# Patient Record
Sex: Female | Born: 1935 | Race: Black or African American | Hispanic: No | Smoking: Never smoker
Health system: Southern US, Community
[De-identification: ages and names within clinical notes are randomized; demographics above are authoritative.]

## PROBLEM LIST (undated history)

## (undated) DIAGNOSIS — E079 Disorder of thyroid, unspecified: Secondary | ICD-10-CM

## (undated) DIAGNOSIS — J45909 Unspecified asthma, uncomplicated: Secondary | ICD-10-CM

## (undated) HISTORY — PX: JOINT REPLACEMENT: SHX530

---

## 1998-07-25 ENCOUNTER — Ambulatory Visit (HOSPITAL_BASED_OUTPATIENT_CLINIC_OR_DEPARTMENT_OTHER): Admission: RE | Admit: 1998-07-25 | Discharge: 1998-07-25 | Payer: Self-pay | Admitting: Ophthalmology

## 2003-12-04 ENCOUNTER — Emergency Department (HOSPITAL_COMMUNITY): Admission: EM | Admit: 2003-12-04 | Discharge: 2003-12-04 | Payer: Self-pay

## 2004-02-13 ENCOUNTER — Emergency Department (HOSPITAL_COMMUNITY): Admission: EM | Admit: 2004-02-13 | Discharge: 2004-02-13 | Payer: Self-pay

## 2004-06-04 ENCOUNTER — Encounter: Admission: RE | Admit: 2004-06-04 | Discharge: 2004-06-04 | Payer: Self-pay | Admitting: Family Medicine

## 2004-09-23 ENCOUNTER — Ambulatory Visit (HOSPITAL_COMMUNITY): Admission: RE | Admit: 2004-09-23 | Discharge: 2004-09-23 | Payer: Self-pay | Admitting: Gastroenterology

## 2005-03-30 ENCOUNTER — Emergency Department (HOSPITAL_COMMUNITY): Admission: EM | Admit: 2005-03-30 | Discharge: 2005-03-30 | Payer: Self-pay | Admitting: Emergency Medicine

## 2009-01-29 ENCOUNTER — Emergency Department (HOSPITAL_COMMUNITY): Admission: EM | Admit: 2009-01-29 | Discharge: 2009-01-29 | Payer: Self-pay | Admitting: Emergency Medicine

## 2009-01-30 ENCOUNTER — Emergency Department (HOSPITAL_COMMUNITY): Admission: EM | Admit: 2009-01-30 | Discharge: 2009-01-31 | Payer: Self-pay | Admitting: Emergency Medicine

## 2010-01-16 ENCOUNTER — Emergency Department (HOSPITAL_COMMUNITY): Admission: EM | Admit: 2010-01-16 | Discharge: 2010-01-16 | Payer: Self-pay | Admitting: Emergency Medicine

## 2011-05-14 NOTE — Op Note (Signed)
NAMEAGRIPINA, Angela Brennan          ACCOUNT NO.:  [id number]   MEDICAL RECORD NO.:  [id number]          PATIENT TYPE:  AMB   LOCATION:  ENDO                         FACILITY:  [facility]   PHYSICIAN:  Anselmo Rod, M.D.  DATE OF BIRTH:  1932-09-29   DATE OF PROCEDURE:  09/23/2004  DATE OF DISCHARGE:  09/23/2004                                 OPERATIVE REPORT   PROCEDURE PERFORMED:  Screening colonoscopy.   ENDOSCOPIST:  Anselmo Rod, M.D.   INSTRUMENT USED:  Olympus video colonoscope.   INDICATION FOR PROCEDURE:  A 77 year old African-American female undergoing  screening colonoscopy.  Rule out colonic polyps, masses, etc.   PREPROCEDURE PREPARATION:  Informed consent was procured from the patient.  The patient was fasted for eight hours prior to the procedure and prepped  with a bottle of magnesium citrate and a gallon of GoLYTELY the night prior  to the procedure.   PREPROCEDURE PHYSICAL:  VITAL SIGNS:  The patient had stable vital signs.  NECK:  Supple.  CHEST:  Clear to auscultation.  S1, S2 regular.  No murmur, rub, or gallop,  rales, rhonchi, or wheezing.  ABDOMEN:  Soft with normal bowel sounds.   DESCRIPTION OF PROCEDURE:  The patient was placed in the left lateral  decubitus position and sedated with 60 mg of Demerol and 6 mg of Versed in  slow incremental doses.  Once the patient was adequately sedate and  maintained on low-flow oxygen and continuous cardiac monitoring, the Olympus  video colonoscope was advanced from the rectum to the cecum.  Except for  small internal and external hemorrhoids seen on retroflexion and anal  inspection, respectively, no other abnormalities were noted.  The entire  colonic mucosa appeared healthy with a normal vascular pattern.  The  appendiceal orifice and the ileocecal valve were clearly visualized and  photographed.  No masses, polyps, or diverticula were identified.  The  patient tolerated the procedure well without immediate  complications.   IMPRESSION:  1.  Normal colonoscopy up to the cecum except for small internal and      external hemorrhoids.  2.  No masses, polyps, or diverticula seen.   RECOMMENDATIONS:  1.  Continue a high-fiber diet with liberal fluid intake.  2.  Repeat CRC screening in the next 10 years unless the patient develops      any abnormal symptoms in the interim.  3.  Outpatient follow-up as the need arises in the future.       JNM/MEDQ  D:  09/24/2004  T:  09/25/2004  Job:  [id number]   cc:   Renaye Rakers, M.D.  [phone] [address]  [place]  [place] [address]  Fax: [phone]

## 2012-02-17 DIAGNOSIS — I10 Essential (primary) hypertension: Secondary | ICD-10-CM | POA: Diagnosis not present

## 2012-02-18 DIAGNOSIS — H2510 Age-related nuclear cataract, unspecified eye: Secondary | ICD-10-CM | POA: Diagnosis not present

## 2012-02-18 DIAGNOSIS — G43819 Other migraine, intractable, without status migrainosus: Secondary | ICD-10-CM | POA: Diagnosis not present

## 2012-02-18 DIAGNOSIS — H5710 Ocular pain, unspecified eye: Secondary | ICD-10-CM | POA: Diagnosis not present

## 2012-07-03 DIAGNOSIS — H6120 Impacted cerumen, unspecified ear: Secondary | ICD-10-CM | POA: Diagnosis not present

## 2012-07-04 DIAGNOSIS — I10 Essential (primary) hypertension: Secondary | ICD-10-CM | POA: Diagnosis not present

## 2012-07-06 DIAGNOSIS — H6120 Impacted cerumen, unspecified ear: Secondary | ICD-10-CM | POA: Diagnosis not present

## 2012-08-21 DIAGNOSIS — J069 Acute upper respiratory infection, unspecified: Secondary | ICD-10-CM | POA: Diagnosis not present

## 2012-10-12 DIAGNOSIS — H6690 Otitis media, unspecified, unspecified ear: Secondary | ICD-10-CM | POA: Diagnosis not present

## 2012-10-23 DIAGNOSIS — H6690 Otitis media, unspecified, unspecified ear: Secondary | ICD-10-CM | POA: Diagnosis not present

## 2012-10-30 DIAGNOSIS — H538 Other visual disturbances: Secondary | ICD-10-CM | POA: Diagnosis not present

## 2012-10-30 DIAGNOSIS — H2510 Age-related nuclear cataract, unspecified eye: Secondary | ICD-10-CM | POA: Diagnosis not present

## 2012-10-30 DIAGNOSIS — H81399 Other peripheral vertigo, unspecified ear: Secondary | ICD-10-CM | POA: Diagnosis not present

## 2012-11-01 DIAGNOSIS — H8190 Unspecified disorder of vestibular function, unspecified ear: Secondary | ICD-10-CM | POA: Diagnosis not present

## 2012-11-13 ENCOUNTER — Emergency Department (HOSPITAL_COMMUNITY)
Admission: EM | Admit: 2012-11-13 | Discharge: 2012-11-13 | Disposition: A | Discharge status: Home / Self Care | Payer: Medicare Other | Attending: Emergency Medicine | Admitting: Emergency Medicine

## 2012-11-13 ENCOUNTER — Encounter (HOSPITAL_COMMUNITY): Payer: Self-pay

## 2012-11-13 DIAGNOSIS — E079 Disorder of thyroid, unspecified: Secondary | ICD-10-CM | POA: Insufficient documentation

## 2012-11-13 DIAGNOSIS — F411 Generalized anxiety disorder: Secondary | ICD-10-CM | POA: Diagnosis not present

## 2012-11-13 DIAGNOSIS — R002 Palpitations: Secondary | ICD-10-CM

## 2012-11-13 DIAGNOSIS — J45909 Unspecified asthma, uncomplicated: Secondary | ICD-10-CM | POA: Diagnosis not present

## 2012-11-13 DIAGNOSIS — H938X9 Other specified disorders of ear, unspecified ear: Secondary | ICD-10-CM | POA: Insufficient documentation

## 2012-11-13 DIAGNOSIS — H6590 Unspecified nonsuppurative otitis media, unspecified ear: Secondary | ICD-10-CM

## 2012-11-13 DIAGNOSIS — R6889 Other general symptoms and signs: Secondary | ICD-10-CM | POA: Diagnosis not present

## 2012-11-13 HISTORY — DX: Unspecified asthma, uncomplicated: J45.909

## 2012-11-13 HISTORY — DX: Disorder of thyroid, unspecified: E07.9

## 2012-11-13 NOTE — ED Notes (Signed)
Pt sts she was driving to a Dr. appointment and started having palpitations.  Pt sts no chest pain but felt her heart speed up.

## 2012-11-13 NOTE — ED Provider Notes (Signed)
History     CSN: [id number]  Arrival date & time 11/13/12  1412   First MD Initiated Contact with Patient 11/13/12 1436      Chief Complaint  Patient presents with  . Palpitations  . Anxiety    (Consider location/radiation/quality/duration/timing/severity/associated sxs/prior treatment) HPIElizabeth A Brennan is a 78 y.o. female who was driving to her PCP office when she had an episode of palpitations "felt like my heart was racing." This was not associated with diaphoresis, dyspnea, chest pain, dizziness, vertigo, facial pain, cough. She said she ate lunch prior to leaving for the MD office and may have eaten too fast.  She felt uncomfortable after the fleeting palpitations, so she turned around and returned home calling 911 on the way. EMS picked her up at home.   Pt had left ear irrigated and got secondarily infected.  She had seen an ENT in consult, with drops to the ear x1, it was getting better, had no vertigo.  She takes synthroid - no recent dose changes, Dr. Parke Simmers checked her TSH 4 weeks ago and it was normal.   Past Medical History  Diagnosis Date  . Asthma   . Thyroid disease     Past Surgical History  Procedure Date  . Joint replacement     History reviewed. No pertinent family history.  History  Substance Use Topics  . Smoking status: Never Smoker   . Smokeless tobacco: Not on file  . Alcohol Use:     OB History    Grav Para Term Preterm Abortions TAB SAB Ect Mult Living                  Review of Systems At least 10pt or greater review of systems completed and are negative except where specified in the HPI.  Allergies  Review of patient's allergies indicates not on file.  Home Medications  No current outpatient prescriptions on file.  BP 117/63  Pulse 69  Temp 98.2 F (36.8 C) (Oral)  Resp 16  SpO2 98%  Physical Exam  Nursing notes reviewed.  Electronic medical record reviewed. VITAL SIGNS:   Filed Vitals:   11/13/12 1412 11/13/12  1416  BP:  117/63  Pulse:  69  Temp:  98.2 F (36.8 C)  TempSrc:  Oral  Resp:  16  SpO2: 100% 98%   CONSTITUTIONAL: Awake, oriented, appears non-toxic HENT: Atraumatic, normocephalic, oral mucosa pink and moist, airway patent. Nares patent without drainage. External ears normal. Left middle ear effusion. EYES: Conjunctiva clear, EOMI, PERRLA NECK: Trachea midline, non-tender, supple CARDIOVASCULAR: Normal heart rate, Normal rhythm, No murmurs, rubs, gallops PULMONARY/CHEST: Clear to auscultation, no rhonchi, wheezes, or rales. Symmetrical breath sounds. Non-tender. ABDOMINAL: Non-distended, soft, non-tender - no rebound or guarding.  BS normal. NEUROLOGIC: Non-focal, moving all four extremities, no gross sensory or motor deficits. EXTREMITIES: No clubbing, cyanosis, or edema SKIN: Warm, Dry, No erythema, No rash  ED Course  Procedures (including critical care time)  Date: 11/14/2012  Rate: 65  Rhythm: normal sinus rhythm  QRS Axis: normal  Intervals: normal  ST/T Wave abnormalities: non-specific T-wave changes  Conduction Disutrbances: none  Narrative Interpretation: unremarkable, non-ischemic, no arrythmia  Labs Reviewed - No data to display No results found.   1. Heart palpitations   2. Middle ear effusion       MDM  Angela Brennan is a 78 y.o. female presents with palpitations.  Palpitations were transient and had no other associated symptoms.  Without other symptoms, a  normal EKG here, I don't think any further workup is needed.  PT appears well and has normal VS.  Doubt a lethal arrythmia such as Vtach without some dizziness etc.  Discussed options for diagnostics going forward including possible cardiac event monitoring.  Pt would like to follow up with PCP for this.  She would like to go home, I see no reason to admit to the hospital now - no further testing indicated at this time.  I explained the diagnosis and have given explicit precautions to return to the  ER including recurrent palpitations associated with any other symptoms or any other new or worsening symptoms. The patient understands and accepts the medical plan as it's been dictated and I have answered their questions. Discharge instructions concerning home care and prescriptions have been given.  The patient is STABLE and is discharged to home in good condition.         Jones Skene, MD 11/14/12 1805

## 2012-12-11 DIAGNOSIS — H81399 Other peripheral vertigo, unspecified ear: Secondary | ICD-10-CM | POA: Diagnosis not present

## 2012-12-26 DIAGNOSIS — H9209 Otalgia, unspecified ear: Secondary | ICD-10-CM | POA: Diagnosis not present

## 2013-01-22 DIAGNOSIS — I10 Essential (primary) hypertension: Secondary | ICD-10-CM | POA: Diagnosis not present

## 2013-01-22 DIAGNOSIS — J209 Acute bronchitis, unspecified: Secondary | ICD-10-CM | POA: Diagnosis not present

## 2013-04-23 DIAGNOSIS — E7800 Pure hypercholesterolemia, unspecified: Secondary | ICD-10-CM | POA: Diagnosis not present

## 2013-04-23 DIAGNOSIS — I10 Essential (primary) hypertension: Secondary | ICD-10-CM | POA: Diagnosis not present

## 2013-04-23 DIAGNOSIS — E781 Pure hyperglyceridemia: Secondary | ICD-10-CM | POA: Diagnosis not present

## 2013-09-03 DIAGNOSIS — E781 Pure hyperglyceridemia: Secondary | ICD-10-CM | POA: Diagnosis not present

## 2013-09-03 DIAGNOSIS — I10 Essential (primary) hypertension: Secondary | ICD-10-CM | POA: Diagnosis not present

## 2013-11-07 DIAGNOSIS — I10 Essential (primary) hypertension: Secondary | ICD-10-CM | POA: Diagnosis not present

## 2013-11-07 DIAGNOSIS — E7800 Pure hypercholesterolemia, unspecified: Secondary | ICD-10-CM | POA: Diagnosis not present

## 2014-01-28 DIAGNOSIS — H60399 Other infective otitis externa, unspecified ear: Secondary | ICD-10-CM | POA: Diagnosis not present

## 2014-01-28 DIAGNOSIS — M1250 Traumatic arthropathy, unspecified site: Secondary | ICD-10-CM | POA: Diagnosis not present

## 2014-01-31 ENCOUNTER — Ambulatory Visit
Admission: RE | Admit: 2014-01-31 | Discharge: 2014-01-31 | Disposition: A | Discharge status: Home / Self Care | Payer: Medicare Other | Source: Ambulatory Visit | Attending: Family Medicine | Admitting: Family Medicine

## 2014-01-31 ENCOUNTER — Other Ambulatory Visit: Payer: Self-pay | Admitting: Family Medicine

## 2014-01-31 DIAGNOSIS — IMO0002 Reserved for concepts with insufficient information to code with codable children: Secondary | ICD-10-CM | POA: Diagnosis not present

## 2014-01-31 DIAGNOSIS — M1250 Traumatic arthropathy, unspecified site: Secondary | ICD-10-CM

## 2014-01-31 DIAGNOSIS — M1710 Unilateral primary osteoarthritis, unspecified knee: Secondary | ICD-10-CM | POA: Diagnosis not present

## 2014-02-14 DIAGNOSIS — J069 Acute upper respiratory infection, unspecified: Secondary | ICD-10-CM | POA: Diagnosis not present

## 2014-04-16 DIAGNOSIS — F329 Major depressive disorder, single episode, unspecified: Secondary | ICD-10-CM | POA: Diagnosis not present

## 2014-04-16 DIAGNOSIS — F411 Generalized anxiety disorder: Secondary | ICD-10-CM | POA: Diagnosis not present

## 2014-04-16 DIAGNOSIS — F3289 Other specified depressive episodes: Secondary | ICD-10-CM | POA: Diagnosis not present

## 2014-05-22 DIAGNOSIS — E781 Pure hyperglyceridemia: Secondary | ICD-10-CM | POA: Diagnosis not present

## 2014-05-22 DIAGNOSIS — F411 Generalized anxiety disorder: Secondary | ICD-10-CM | POA: Diagnosis not present

## 2014-05-22 DIAGNOSIS — I10 Essential (primary) hypertension: Secondary | ICD-10-CM | POA: Diagnosis not present

## 2014-06-28 DIAGNOSIS — J449 Chronic obstructive pulmonary disease, unspecified: Secondary | ICD-10-CM | POA: Diagnosis not present

## 2014-06-28 DIAGNOSIS — I10 Essential (primary) hypertension: Secondary | ICD-10-CM | POA: Diagnosis not present

## 2014-08-21 DIAGNOSIS — I10 Essential (primary) hypertension: Secondary | ICD-10-CM | POA: Diagnosis not present

## 2014-08-21 DIAGNOSIS — E781 Pure hyperglyceridemia: Secondary | ICD-10-CM | POA: Diagnosis not present

## 2014-08-21 DIAGNOSIS — J019 Acute sinusitis, unspecified: Secondary | ICD-10-CM | POA: Diagnosis not present

## 2014-10-20 ENCOUNTER — Encounter (HOSPITAL_COMMUNITY): Payer: Self-pay | Admitting: Emergency Medicine

## 2014-10-20 ENCOUNTER — Emergency Department (HOSPITAL_COMMUNITY): Payer: Medicare Other

## 2014-10-20 DIAGNOSIS — Z880 Allergy status to penicillin: Secondary | ICD-10-CM | POA: Insufficient documentation

## 2014-10-20 DIAGNOSIS — E039 Hypothyroidism, unspecified: Secondary | ICD-10-CM | POA: Insufficient documentation

## 2014-10-20 DIAGNOSIS — R609 Edema, unspecified: Secondary | ICD-10-CM | POA: Insufficient documentation

## 2014-10-20 DIAGNOSIS — J45901 Unspecified asthma with (acute) exacerbation: Secondary | ICD-10-CM | POA: Insufficient documentation

## 2014-10-20 DIAGNOSIS — Z79899 Other long term (current) drug therapy: Secondary | ICD-10-CM | POA: Diagnosis not present

## 2014-10-20 DIAGNOSIS — R0602 Shortness of breath: Secondary | ICD-10-CM | POA: Diagnosis not present

## 2014-10-20 MED ORDER — IPRATROPIUM BROMIDE 0.02 % IN SOLN
0.5000 mg | Freq: Once | RESPIRATORY_TRACT | Status: AC
  Administered 2014-10-20: 0.5 mg via RESPIRATORY_TRACT
  Filled 2014-10-20: qty 2.5

## 2014-10-20 MED ORDER — ALBUTEROL SULFATE (2.5 MG/3ML) 0.083% IN NEBU
5.0000 mg | INHALATION_SOLUTION | Freq: Once | RESPIRATORY_TRACT | Status: AC
  Administered 2014-10-20: 5 mg via RESPIRATORY_TRACT
  Filled 2014-10-20: qty 6

## 2014-10-20 NOTE — ED Notes (Signed)
The pt has asthma and she started having  Difficulty breqathing that started this afternoon.  She has an inhaler but it is not helping.  No pain anywhere.  No audible wheezes

## 2014-10-21 ENCOUNTER — Emergency Department (HOSPITAL_COMMUNITY)
Admission: EM | Admit: 2014-10-21 | Discharge: 2014-10-21 | Disposition: A | Discharge status: Home / Self Care | Payer: Medicare Other | Attending: Emergency Medicine | Admitting: Emergency Medicine

## 2014-10-21 DIAGNOSIS — R0602 Shortness of breath: Secondary | ICD-10-CM

## 2014-10-21 DIAGNOSIS — J45901 Unspecified asthma with (acute) exacerbation: Secondary | ICD-10-CM | POA: Diagnosis not present

## 2014-10-21 LAB — I-STAT TROPONIN, ED
Troponin i, poc: 0 ng/mL (ref 0.00–0.08)
Troponin i, poc: 0.01 ng/mL (ref 0.00–0.08)

## 2014-10-21 LAB — CBC WITH DIFFERENTIAL/PLATELET
Basophils Absolute: 0.1 10*3/uL (ref 0.0–0.1)
Basophils Relative: 2 % — ABNORMAL HIGH (ref 0–1)
Eosinophils Absolute: 0.4 10*3/uL (ref 0.0–0.7)
Eosinophils Relative: 8 % — ABNORMAL HIGH (ref 0–5)
HCT: 37 % (ref 36.0–46.0)
Hemoglobin: 12.4 g/dL (ref 12.0–15.0)
Lymphocytes Relative: 40 % (ref 12–46)
Lymphs Abs: 2 10*3/uL (ref 0.7–4.0)
MCH: 30.7 pg (ref 26.0–34.0)
MCHC: 33.5 g/dL (ref 30.0–36.0)
MCV: 91.6 fL (ref 78.0–100.0)
Monocytes Absolute: 0.5 10*3/uL (ref 0.1–1.0)
Monocytes Relative: 11 % (ref 3–12)
Neutro Abs: 2.1 10*3/uL (ref 1.7–7.7)
Neutrophils Relative %: 41 % — ABNORMAL LOW (ref 43–77)
Platelets: 228 10*3/uL (ref 150–400)
RBC: 4.04 MIL/uL (ref 3.87–5.11)
RDW: 14.9 % (ref 11.5–15.5)
WBC: 5.1 10*3/uL (ref 4.0–10.5)

## 2014-10-21 LAB — BASIC METABOLIC PANEL
Anion gap: 13 (ref 5–15)
BUN: 12 mg/dL (ref 6–23)
CO2: 25 mEq/L (ref 19–32)
Calcium: 9.4 mg/dL (ref 8.4–10.5)
Chloride: 105 mEq/L (ref 96–112)
Creatinine, Ser: 0.89 mg/dL (ref 0.50–1.10)
GFR calc Af Amer: 69 mL/min — ABNORMAL LOW (ref >90)
GFR calc non Af Amer: 60 mL/min — ABNORMAL LOW (ref >90)
Glucose, Bld: 104 mg/dL — ABNORMAL HIGH (ref 70–99)
Potassium: 3.6 mEq/L — ABNORMAL LOW (ref 3.7–5.3)
Sodium: 143 mEq/L (ref 137–147)

## 2014-10-21 MED ORDER — ASPIRIN 325 MG PO TABS
325.0000 mg | ORAL_TABLET | Freq: Once | ORAL | Status: AC
  Administered 2014-10-21: 325 mg via ORAL
  Filled 2014-10-21: qty 1

## 2014-10-21 MED ORDER — ALBUTEROL SULFATE HFA 108 (90 BASE) MCG/ACT IN AERS
2.0000 | INHALATION_SPRAY | Freq: Once | RESPIRATORY_TRACT | Status: AC
  Administered 2014-10-21: 2 via RESPIRATORY_TRACT
  Filled 2014-10-21: qty 6.7

## 2014-10-21 NOTE — Discharge Instructions (Signed)
Shortness of Breath Angela Brennan, you were seen for shortness of breath. Your cardiac blood levels, EKG and chest x-ray were all normal. Follow-up with your primary care physician within 3 days for continued treatment of your shortness of breath. If any of your symptoms worsen come back to emergency department immediately for repeat evaluation. Thank you. Shortness of breath means you have trouble breathing. Shortness of breath needs medical care right away. HOME CARE   Do not smoke.  Avoid being around chemicals or things (paint fumes, dust) that may bother your breathing.  Rest as needed. Slowly begin your normal activities.  Only take medicines as told by your doctor.  Keep all doctor visits as told. GET HELP RIGHT AWAY IF:   Your shortness of breath gets worse.  You feel lightheaded, pass out (faint), or have a cough that is not helped by medicine.  You cough up blood.  You have pain with breathing.  You have pain in your chest, arms, shoulders, or belly (abdomen).  You have a fever.  You cannot walk up stairs or exercise the way you normally do.  You do not get better in the time expected.  You have a hard time doing normal activities even with rest.  You have problems with your medicines.  You have any new symptoms. MAKE SURE YOU:  Understand these instructions.  Will watch your condition.  Will get help right away if you are not doing well or get worse. Document Released: 05/31/2008 Document Revised: 12/18/2013 Document Reviewed: 02/28/2012 [facility] Patient Information 2015 [place]Care, [place]. This information is not intended to replace advice given to you by your health care provider. Make sure you discuss any questions you have with your health care provider.

## 2014-10-21 NOTE — ED Notes (Signed)
Pt discharged home with all belongings, pt alert, oriented and ambulatory upon discharge. No new RX prescribed, pt verbalizes understanding of discharge instructions, pt driven home by family at bedside

## 2014-10-21 NOTE — ED Provider Notes (Signed)
CSN: [id number]     Arrival date & time 10/20/14  2109 History   First MD Initiated Contact with Patient 10/21/14 0111     Chief Complaint  Patient presents with  . Shortness of Breath     (Consider location/radiation/quality/duration/timing/severity/associated sxs/prior Treatment) HPI Angela Brennan is a 80 y.o. female with past medical history of hyperlipidemia, asthma, hypothyroidism coming in with shortness of breath. She states this began earlier this afternoon while at church. He has a history of asthma and thought she had an acute exacerbation. She did not have her albuterol with her. She states her trigger is unknown to her. She denies any chest pain. She denies any history of fevers or URI symptoms.  Patient states the soreness of breath has now resolved without any intervention. She did receive a breathing treatment in triage. She called her primary care physician Dr. Vedia CofferBlack who she has been with for 30 years who advised her to go to the emergency department for evaluation.  10 Systems reviewed and are negative for acute change except as noted in the HPI.     Past Medical History  Diagnosis Date  . Asthma   . Thyroid disease    Past Surgical History  Procedure Laterality Date  . Joint replacement     No family history on file. History  Substance Use Topics  . Smoking status: Never Smoker   . Smokeless tobacco: Not on file  . Alcohol Use: Yes   OB History   Grav Para Term Preterm Abortions TAB SAB Ect Mult Living                 Review of Systems    Allergies  Penicillins  Home Medications   Prior to Admission medications   Medication Sig Start Date End Date Taking? Authorizing Provider  diltiazem (TIAZAC) 120 MG 24 hr capsule Take 120 mg by mouth daily.    Historical Provider, MD  irbesartan-hydrochlorothiazide (AVALIDE) 150-12.5 MG per tablet Take 1 tablet by mouth daily.    Historical Provider, MD  levothyroxine (SYNTHROID, LEVOTHROID) 50 MCG  tablet Take 50 mcg by mouth daily.    Historical Provider, MD   BP 122/60  Pulse 61  Temp(Src) 98.4 F (36.9 C) (Oral)  Resp 18  Ht 5' (1.524 m)  Wt 122 lb (55.339 kg)  BMI 23.83 kg/m2  SpO2 100% Physical Exam  Nursing note and vitals reviewed. Constitutional: She is oriented to person, place, and time. She appears well-developed and well-nourished. No distress.  HENT:  Head: Normocephalic and atraumatic.  Nose: Nose normal.  Mouth/Throat: Oropharynx is clear and moist. No oropharyngeal exudate.  Eyes: Conjunctivae and EOM are normal. Pupils are equal, round, and reactive to light. No scleral icterus.  Neck: Normal range of motion. Neck supple. No JVD present. No tracheal deviation present. No thyromegaly present.  Cardiovascular: Normal rate, regular rhythm and normal heart sounds.  Exam reveals no gallop and no friction rub.   No murmur heard. Pulmonary/Chest: Effort normal and breath sounds normal. No respiratory distress. She has no wheezes. She exhibits no tenderness.  Abdominal: Soft. Bowel sounds are normal. She exhibits no distension and no mass. There is no tenderness. There is no rebound and no guarding.  Musculoskeletal: Normal range of motion. She exhibits edema. She exhibits no tenderness.  Lymphadenopathy:    She has no cervical adenopathy.  Neurological: She is alert and oriented to person, place, and time. No cranial nerve deficit. She exhibits normal muscle tone.  Skin: Skin is warm and dry. No rash noted. She is not diaphoretic. No erythema. No pallor.    ED Course  Procedures (including critical care time) Labs Review Labs Reviewed  CBC WITH DIFFERENTIAL - Abnormal; Notable for the following:    Neutrophils Relative % 41 (*)    Eosinophils Relative 8 (*)    Basophils Relative 2 (*)    All other components within normal limits  BASIC METABOLIC PANEL - Abnormal; Notable for the following:    Potassium 3.6 (*)    Glucose, Bld 104 (*)    GFR calc non Af Amer  60 (*)    GFR calc Af Amer 69 (*)    All other components within normal limits  I-STAT TROPOININ, ED  Rosezena SensorI-STAT TROPOININ, ED    Imaging Review Dg Chest 2 View  10/20/2014   CLINICAL DATA:  Shortness of breath.  Initial encounter  EXAM: CHEST  2 VIEW  COMPARISON:  None.  FINDINGS: Borderline cardiomegaly. Gas-filled lower mediastinal mass consistent with hiatal hernia. Negative aortic contours.  There is no edema, consolidation, effusion, or pneumothorax.  No acute osseous findings.  IMPRESSION: 1. No evidence of acute cardiopulmonary disease. 2. Large hiatal hernia.   Electronically Signed   By: Tiburcio PeaJonathan  Watts M.D.   On: 10/20/2014 23:10     EKG Interpretation   Date/Time:  Monday October 21 2014 01:42:35 EDT Ventricular Rate:  57 PR Interval:  178 QRS Duration: 93 QT Interval:  416 QTC Calculation: 405 R Axis:   -6 Text Interpretation:  Sinus rhythm Borderline low voltage, extremity leads  No significant change since last tracing Confirmed by Erroll Lunani, Takima Encina  [place] [phone]([id number]) on 10/21/2014 2:00:01 AM      MDM   Final diagnoses:  SOB (shortness of breath)    Patient presented to emergency department for shortness of breath. Since the patient is elderly and female she can present atypically for ACS. Will evaluate with 2 troponins and heart score.  Troponins are negative 2. EKG is unchanged and does not reveal any ischemia.  Patient will be discharged home with primary care follow-up within 3 days for possible stress test. She is amenable to this plan. Albuterol inhaler was refilled for her. Her vital signs remained stable and she is safe for discharge.   Tomasita CrumbleAdeleke Zaion Hreha, MD 10/21/14 [phone]

## 2014-10-24 DIAGNOSIS — I517 Cardiomegaly: Secondary | ICD-10-CM | POA: Diagnosis not present

## 2014-10-24 DIAGNOSIS — J4520 Mild intermittent asthma, uncomplicated: Secondary | ICD-10-CM | POA: Diagnosis not present

## 2014-10-24 DIAGNOSIS — E039 Hypothyroidism, unspecified: Secondary | ICD-10-CM | POA: Diagnosis not present

## 2014-11-19 DIAGNOSIS — I10 Essential (primary) hypertension: Secondary | ICD-10-CM | POA: Diagnosis not present

## 2014-11-19 DIAGNOSIS — J4530 Mild persistent asthma, uncomplicated: Secondary | ICD-10-CM | POA: Diagnosis not present

## 2014-11-19 DIAGNOSIS — E780 Pure hypercholesterolemia: Secondary | ICD-10-CM | POA: Diagnosis not present

## 2014-11-19 DIAGNOSIS — E039 Hypothyroidism, unspecified: Secondary | ICD-10-CM | POA: Diagnosis not present

## 2015-02-17 DIAGNOSIS — E039 Hypothyroidism, unspecified: Secondary | ICD-10-CM | POA: Diagnosis not present

## 2015-02-17 DIAGNOSIS — I10 Essential (primary) hypertension: Secondary | ICD-10-CM | POA: Diagnosis not present

## 2015-02-17 DIAGNOSIS — I517 Cardiomegaly: Secondary | ICD-10-CM | POA: Diagnosis not present

## 2015-02-17 DIAGNOSIS — H8303 Labyrinthitis, bilateral: Secondary | ICD-10-CM | POA: Diagnosis not present

## 2015-02-28 DIAGNOSIS — I10 Essential (primary) hypertension: Secondary | ICD-10-CM | POA: Diagnosis not present

## 2015-02-28 DIAGNOSIS — F064 Anxiety disorder due to known physiological condition: Secondary | ICD-10-CM | POA: Diagnosis not present

## 2015-05-19 DIAGNOSIS — E039 Hypothyroidism, unspecified: Secondary | ICD-10-CM | POA: Diagnosis not present

## 2015-05-19 DIAGNOSIS — E780 Pure hypercholesterolemia: Secondary | ICD-10-CM | POA: Diagnosis not present

## 2015-05-19 DIAGNOSIS — I10 Essential (primary) hypertension: Secondary | ICD-10-CM | POA: Diagnosis not present

## 2015-07-03 DIAGNOSIS — E039 Hypothyroidism, unspecified: Secondary | ICD-10-CM | POA: Diagnosis not present

## 2015-07-03 DIAGNOSIS — R413 Other amnesia: Secondary | ICD-10-CM | POA: Diagnosis not present

## 2015-07-03 DIAGNOSIS — I10 Essential (primary) hypertension: Secondary | ICD-10-CM | POA: Diagnosis not present

## 2015-07-03 DIAGNOSIS — R634 Abnormal weight loss: Secondary | ICD-10-CM | POA: Diagnosis not present

## 2015-08-14 DIAGNOSIS — E780 Pure hypercholesterolemia: Secondary | ICD-10-CM | POA: Diagnosis not present

## 2015-08-14 DIAGNOSIS — I10 Essential (primary) hypertension: Secondary | ICD-10-CM | POA: Diagnosis not present

## 2015-08-14 DIAGNOSIS — I517 Cardiomegaly: Secondary | ICD-10-CM | POA: Diagnosis not present

## 2015-08-14 DIAGNOSIS — J4520 Mild intermittent asthma, uncomplicated: Secondary | ICD-10-CM | POA: Diagnosis not present

## 2015-09-04 ENCOUNTER — Emergency Department (HOSPITAL_COMMUNITY): Payer: Medicare Other

## 2015-09-04 ENCOUNTER — Encounter (HOSPITAL_COMMUNITY): Payer: Self-pay | Admitting: Emergency Medicine

## 2015-09-04 ENCOUNTER — Emergency Department (HOSPITAL_COMMUNITY)
Admission: EM | Admit: 2015-09-04 | Discharge: 2015-09-04 | Disposition: A | Discharge status: Home / Self Care | Payer: Medicare Other | Attending: Emergency Medicine | Admitting: Emergency Medicine

## 2015-09-04 DIAGNOSIS — E079 Disorder of thyroid, unspecified: Secondary | ICD-10-CM | POA: Insufficient documentation

## 2015-09-04 DIAGNOSIS — Z880 Allergy status to penicillin: Secondary | ICD-10-CM | POA: Diagnosis not present

## 2015-09-04 DIAGNOSIS — R001 Bradycardia, unspecified: Secondary | ICD-10-CM | POA: Diagnosis not present

## 2015-09-04 DIAGNOSIS — J45901 Unspecified asthma with (acute) exacerbation: Secondary | ICD-10-CM | POA: Diagnosis not present

## 2015-09-04 DIAGNOSIS — R0602 Shortness of breath: Secondary | ICD-10-CM | POA: Diagnosis not present

## 2015-09-04 DIAGNOSIS — R069 Unspecified abnormalities of breathing: Secondary | ICD-10-CM | POA: Diagnosis not present

## 2015-09-04 DIAGNOSIS — Z79899 Other long term (current) drug therapy: Secondary | ICD-10-CM | POA: Diagnosis not present

## 2015-09-04 LAB — URINALYSIS, ROUTINE W REFLEX MICROSCOPIC
BILIRUBIN URINE: NEGATIVE
Glucose, UA: NEGATIVE mg/dL
HGB URINE DIPSTICK: NEGATIVE
KETONES UR: NEGATIVE mg/dL
Leukocytes, UA: NEGATIVE
NITRITE: NEGATIVE
PH: 6.5 (ref 5.0–8.0)
Protein, ur: NEGATIVE mg/dL
Specific Gravity, Urine: 1.004 — ABNORMAL LOW (ref 1.005–1.030)
UROBILINOGEN UA: 0.2 mg/dL (ref 0.0–1.0)

## 2015-09-04 LAB — I-STAT TROPONIN, ED
Troponin i, poc: 0.01 ng/mL (ref 0.00–0.08)
Troponin i, poc: 0.01 ng/mL (ref 0.00–0.08)

## 2015-09-04 LAB — BASIC METABOLIC PANEL
Anion gap: 9 (ref 5–15)
BUN: 15 mg/dL (ref 6–20)
CALCIUM: 9.6 mg/dL (ref 8.9–10.3)
CHLORIDE: 105 mmol/L (ref 101–111)
CO2: 26 mmol/L (ref 22–32)
CREATININE: 0.8 mg/dL (ref 0.44–1.00)
GFR calc non Af Amer: >60 mL/min (ref >60)
GLUCOSE: 92 mg/dL (ref 65–99)
Potassium: 4.1 mmol/L (ref 3.5–5.1)
Sodium: 140 mmol/L (ref 135–145)

## 2015-09-04 LAB — CBC
HCT: 39.3 % (ref 36.0–46.0)
Hemoglobin: 13.3 g/dL (ref 12.0–15.0)
MCH: 32 pg (ref 26.0–34.0)
MCHC: 33.8 g/dL (ref 30.0–36.0)
MCV: 94.5 fL (ref 78.0–100.0)
PLATELETS: 240 10*3/uL (ref 150–400)
RBC: 4.16 MIL/uL (ref 3.87–5.11)
RDW: 13.1 % (ref 11.5–15.5)
WBC: 4.3 10*3/uL (ref 4.0–10.5)

## 2015-09-04 LAB — BRAIN NATRIURETIC PEPTIDE: B Natriuretic Peptide: 74.1 pg/mL (ref 0.0–100.0)

## 2015-09-04 NOTE — ED Notes (Signed)
Patient ambulate with no problem by herself.

## 2015-09-04 NOTE — Discharge Instructions (Signed)
Bradycardia °Bradycardia is a term for a heart rate (pulse) that, in adults, is slower than 60 beats per minute. A normal rate is 60 to 100 beats per minute. A heart rate below 60 beats per minute may be normal for some adults with healthy hearts. If the rate is too slow, the heart may have trouble pumping the volume of blood the body needs. If the heart rate gets too low, blood flow to the brain may be decreased and may make you feel lightheaded, dizzy, or faint. °The heart has a natural pacemaker in the top of the heart called the SA node (sinoatrial or sinus node). This pacemaker sends out regular electrical signals to the muscle of the heart, telling the heart muscle when to beat (contract). The electrical signal travels from the upper parts of the heart (atria) through the AV node (atrioventricular node), to the lower chambers of the heart (ventricles). The ventricles squeeze, pumping the blood from your heart to your lungs and to the rest of your body. °CAUSES  °· Problem with the heart's electrical system. °· Problem with the heart's natural pacemaker. °· Heart disease, damage, or infection. °· Medications. °· Problems with minerals and salts (electrolytes). °SYMPTOMS  °· Fainting (syncope). °· Fatigue and weakness. °· Shortness of breath (dyspnea). °· Chest pain (angina). °· Drowsiness. °· Confusion. °DIAGNOSIS  °· An electrocardiogram (ECG) can help your caregiver determine the type of slow heart rate you have. °· If the cause is not seen on an ECG, you may need to wear a heart monitor that records your heart rhythm for several hours or days. °· Blood tests. °TREATMENT  °· Electrolyte supplements. °· Medications. °· Withholding medication which is causing a slow heart rate. °· Pacemaker placement. °SEEK IMMEDIATE MEDICAL CARE IF:  °· You feel lightheaded or faint. °· You develop an irregular heart rate. °· You feel chest pain or have trouble breathing. °MAKE SURE YOU:  °· Understand these  instructions. °· Will watch your condition. °· Will get help right away if you are not doing well or get worse. °Document Released: 09/04/2002 Document Revised: 03/06/2012 Document Reviewed: 03/20/2014 °ExitCare® Patient Information ©2015 [employer], LLC. This information is not intended to replace advice given to you by your health care provider. Make sure you discuss any questions you have with your health care provider. ° °

## 2015-09-04 NOTE — ED Notes (Signed)
Bed: WA07 Expected date:  Expected time:  Means of arrival:  Comments: EMS- 81yo F, SOB/asthma

## 2015-09-04 NOTE — ED Notes (Signed)
Patient in Respiratory Distress, 1300 patient was eating a bowl of cereal and starting feeling short of breath, patient has a history of asthma, feels like a typical asthma attack, tried inhaler with minimal relief, called EMS, lung sounds are clear, patient feels like tightness is in throat, no wheezing observed in throat, no allergic reaction, patient said she didn't choke on anything, no recent illness, patient took a second puff of inhaler before leaving apartment, said it helped a little bit,

## 2015-09-04 NOTE — ED Provider Notes (Signed)
CSN: [id number]     Arrival date & time 09/04/15  1445 History   First MD Initiated Contact with Patient 09/04/15 1541     Chief Complaint  Patient presents with  . Shortness of Breath    Patient in Respiratory Distress, 1300 patient was eating a bowl of cereal and starting feeling short of breath, patient has a history of asthma, feels like a typical asthma attack, tried inhaler with minimal relief, called EMS, lung sounds are clear, patient feels like tightness is in throat, no wheezing observed in throat, no allergic reaction, patient said she didn't choke on anything, no recent illness, patient took a second puff of inhaler before leaving apartment, said it helped a little bit,  . Bradycardia    HPI Pt started having some trouble with feeling short of breath and "woozy"  Yesterday.  It lasted just a few minutes and then went away.  Today at around 1pm it happened again while at rest.  She had eaten shortly before that.  She tried her inhaler with some relief.  Today she was not feeling lightheaded though.  No chest pain.  No leg swelling.  No fever.  No cough.  Now, she is feeling much better.  It lasted less than 1 hour.  She called 911 because her doctor has told her in the past when she feels bad to call 911.  No new medication changes.  Past Medical History  Diagnosis Date  . Asthma   . Thyroid disease    Past Surgical History  Procedure Laterality Date  . Joint replacement     No family history on file. Social History  Substance Use Topics  . Smoking status: Never Smoker   . Smokeless tobacco: Never Used  . Alcohol Use: Yes   OB History    No data available     Review of Systems  Constitutional: Negative for fever.  All other systems reviewed and are negative.     Allergies  Penicillins  Home Medications   Prior to Admission medications   Medication Sig Start Date End Date Taking? Authorizing Provider  diltiazem (TIAZAC) 120 MG 24 hr capsule Take 120 mg by mouth  daily.   Yes Historical Provider, MD  irbesartan-hydrochlorothiazide (AVALIDE) 150-12.5 MG per tablet Take 1 tablet by mouth daily.   Yes Historical Provider, MD  levothyroxine (SYNTHROID, LEVOTHROID) 50 MCG tablet Take 50 mcg by mouth daily.   Yes Historical Provider, MD  meclizine (ANTIVERT) 25 MG tablet Take 25 mg by mouth at bedtime as needed for dizziness.   Yes Historical Provider, MD  naproxen (NAPROSYN) 500 MG tablet Take 250 mg by mouth 2 (two) times daily as needed for moderate pain.   Yes Historical Provider, MD   BP 111/55 mmHg  Pulse 43  Temp(Src) 98.5 F (36.9 C) (Oral)  Resp 15  SpO2 97% Physical Exam  Constitutional: She appears well-developed and well-nourished. No distress.  HENT:  Head: Normocephalic and atraumatic.  Right Ear: External ear normal.  Left Ear: External ear normal.  Eyes: Conjunctivae are normal. Right eye exhibits no discharge. Left eye exhibits no discharge. No scleral icterus.  Neck: Neck supple. No tracheal deviation present.  Cardiovascular: Regular rhythm and intact distal pulses.  Bradycardia present.   Pulmonary/Chest: Effort normal and breath sounds normal. No stridor. No respiratory distress. She has no decreased breath sounds. She has no wheezes.  Few crackles at the bases  Abdominal: Soft. Bowel sounds are normal. She exhibits no distension. There  is no tenderness. There is no rebound and no guarding.  Musculoskeletal: She exhibits no edema or tenderness.  Neurological: She is alert. She has normal strength. No cranial nerve deficit (no facial droop, extraocular movements intact, no slurred speech) or sensory deficit. She exhibits normal muscle tone. She displays no seizure activity. Coordination normal.  Skin: Skin is warm and dry. No rash noted.  Psychiatric: She has a normal mood and affect.  Nursing note and vitals reviewed.   ED Course  Procedures (including critical care time) Labs Review Labs Reviewed  URINALYSIS, ROUTINE W  REFLEX MICROSCOPIC (NOT AT [facility]) - Abnormal; Notable for the following:    Color, Urine STRAW (*)    Specific Gravity, Urine 1.004 (*)    All other components within normal limits  BASIC METABOLIC PANEL  CBC  BRAIN NATRIURETIC PEPTIDE  I-STAT TROPOININ, ED  Rosezena Sensor, ED    Imaging Review Dg Chest 2 View  09/04/2015   CLINICAL DATA:  Weakness and shortness of Breath  EXAM: CHEST - 2 VIEW  COMPARISON:  10/20/2014  FINDINGS: Cardiac shadow is mildly enlarged but stable. A large hiatal hernia is again seen. The lungs are well aerated bilaterally. No focal infiltrate or sizable effusion is noted. Degenerative changes of the thoracic spine are seen.  IMPRESSION: No acute abnormality noted.   Electronically Signed   By: Alcide Clever M.D.   On: 09/04/2015 16:44   I have personally reviewed and evaluated these images and lab results as part of my medical decision-making.   EKG Interpretation   Date/Time:  Thursday September 04 2015 15:10:00 EDT Ventricular Rate:  38 PR Interval:  166 QRS Duration: 91 QT Interval:  433 QTC Calculation: 344 R Axis:   -27 Text Interpretation:  Sinus bradycardia Atrial premature complex  Borderline left axis deviation Borderline T abnormalities, anterior leads  Since last tracing rate slower Confirmed by Natahsa Marian  MD-J, Gracyn Santillanes ([id number]) on  09/04/2015 3:43:03 PM      MDM   Final diagnoses:  Bradycardia    Patient's symptoms had resolved by the time she came into the emergency room. She was monitored for several hours and had no recurrence of her chest discomfort or shortness breath.  Patient has been able to walk around the emergency room without any difficulty. SHe was bradycardic. EKG shows sinus bradycardia. This may be related to her diltiazem that she takes for her blood pressure. I suggested the patient to hold that medication. This may be contributing to her lightheadedness earlier.  I will have her follow up with her primary care doctor to discuss  alternative medication regimen.    Linwood Dibbles, MD 09/04/15 2029

## 2015-09-05 DIAGNOSIS — R001 Bradycardia, unspecified: Secondary | ICD-10-CM | POA: Diagnosis not present

## 2015-09-12 DIAGNOSIS — I10 Essential (primary) hypertension: Secondary | ICD-10-CM | POA: Diagnosis not present

## 2015-09-12 DIAGNOSIS — R001 Bradycardia, unspecified: Secondary | ICD-10-CM | POA: Diagnosis not present

## 2015-10-13 DIAGNOSIS — R001 Bradycardia, unspecified: Secondary | ICD-10-CM | POA: Diagnosis not present

## 2015-10-13 DIAGNOSIS — F064 Anxiety disorder due to known physiological condition: Secondary | ICD-10-CM | POA: Diagnosis not present

## 2015-10-13 DIAGNOSIS — I10 Essential (primary) hypertension: Secondary | ICD-10-CM | POA: Diagnosis not present

## 2015-10-13 DIAGNOSIS — E039 Hypothyroidism, unspecified: Secondary | ICD-10-CM | POA: Diagnosis not present

## 2015-10-29 DIAGNOSIS — J4520 Mild intermittent asthma, uncomplicated: Secondary | ICD-10-CM | POA: Diagnosis not present

## 2015-10-29 DIAGNOSIS — I517 Cardiomegaly: Secondary | ICD-10-CM | POA: Diagnosis not present

## 2015-10-29 DIAGNOSIS — I10 Essential (primary) hypertension: Secondary | ICD-10-CM | POA: Diagnosis not present

## 2015-10-29 DIAGNOSIS — F064 Anxiety disorder due to known physiological condition: Secondary | ICD-10-CM | POA: Diagnosis not present

## 2016-01-11 DIAGNOSIS — J45901 Unspecified asthma with (acute) exacerbation: Secondary | ICD-10-CM | POA: Diagnosis not present

## 2016-01-26 DIAGNOSIS — J45909 Unspecified asthma, uncomplicated: Secondary | ICD-10-CM | POA: Diagnosis not present

## 2016-01-26 DIAGNOSIS — R0989 Other specified symptoms and signs involving the circulatory and respiratory systems: Secondary | ICD-10-CM | POA: Diagnosis not present

## 2016-01-26 DIAGNOSIS — R0602 Shortness of breath: Secondary | ICD-10-CM | POA: Diagnosis not present

## 2016-01-26 DIAGNOSIS — K449 Diaphragmatic hernia without obstruction or gangrene: Secondary | ICD-10-CM | POA: Diagnosis not present

## 2016-01-26 DIAGNOSIS — H9209 Otalgia, unspecified ear: Secondary | ICD-10-CM | POA: Diagnosis not present

## 2016-01-26 DIAGNOSIS — J069 Acute upper respiratory infection, unspecified: Secondary | ICD-10-CM | POA: Diagnosis not present

## 2016-01-26 DIAGNOSIS — M47814 Spondylosis without myelopathy or radiculopathy, thoracic region: Secondary | ICD-10-CM | POA: Diagnosis not present

## 2016-01-26 DIAGNOSIS — R05 Cough: Secondary | ICD-10-CM | POA: Diagnosis not present

## 2016-02-02 DIAGNOSIS — H25813 Combined forms of age-related cataract, bilateral: Secondary | ICD-10-CM | POA: Diagnosis not present

## 2016-02-02 DIAGNOSIS — H43813 Vitreous degeneration, bilateral: Secondary | ICD-10-CM | POA: Diagnosis not present

## 2016-02-19 DIAGNOSIS — K219 Gastro-esophageal reflux disease without esophagitis: Secondary | ICD-10-CM | POA: Diagnosis not present

## 2016-02-19 DIAGNOSIS — E039 Hypothyroidism, unspecified: Secondary | ICD-10-CM | POA: Diagnosis not present

## 2016-02-19 DIAGNOSIS — I10 Essential (primary) hypertension: Secondary | ICD-10-CM | POA: Diagnosis not present

## 2016-02-19 DIAGNOSIS — Z7189 Other specified counseling: Secondary | ICD-10-CM | POA: Diagnosis not present

## 2016-02-19 DIAGNOSIS — K449 Diaphragmatic hernia without obstruction or gangrene: Secondary | ICD-10-CM | POA: Diagnosis not present

## 2016-02-19 DIAGNOSIS — R413 Other amnesia: Secondary | ICD-10-CM | POA: Diagnosis not present

## 2016-03-13 DIAGNOSIS — H2513 Age-related nuclear cataract, bilateral: Secondary | ICD-10-CM | POA: Diagnosis not present

## 2016-05-10 DIAGNOSIS — J4520 Mild intermittent asthma, uncomplicated: Secondary | ICD-10-CM | POA: Diagnosis not present

## 2016-05-10 DIAGNOSIS — Z23 Encounter for immunization: Secondary | ICD-10-CM | POA: Diagnosis not present

## 2016-05-10 DIAGNOSIS — I10 Essential (primary) hypertension: Secondary | ICD-10-CM | POA: Diagnosis not present

## 2016-08-03 DIAGNOSIS — J45901 Unspecified asthma with (acute) exacerbation: Secondary | ICD-10-CM | POA: Diagnosis not present

## 2016-08-03 DIAGNOSIS — I10 Essential (primary) hypertension: Secondary | ICD-10-CM | POA: Diagnosis not present

## 2016-08-03 DIAGNOSIS — R0602 Shortness of breath: Secondary | ICD-10-CM | POA: Diagnosis not present

## 2016-08-03 DIAGNOSIS — R05 Cough: Secondary | ICD-10-CM | POA: Diagnosis not present

## 2016-08-03 DIAGNOSIS — K449 Diaphragmatic hernia without obstruction or gangrene: Secondary | ICD-10-CM | POA: Diagnosis not present

## 2016-08-04 DIAGNOSIS — R05 Cough: Secondary | ICD-10-CM | POA: Diagnosis not present

## 2016-08-04 DIAGNOSIS — K449 Diaphragmatic hernia without obstruction or gangrene: Secondary | ICD-10-CM | POA: Diagnosis not present

## 2016-08-26 DIAGNOSIS — R0602 Shortness of breath: Secondary | ICD-10-CM | POA: Diagnosis not present

## 2016-08-27 DIAGNOSIS — R05 Cough: Secondary | ICD-10-CM | POA: Diagnosis not present

## 2016-08-27 DIAGNOSIS — K449 Diaphragmatic hernia without obstruction or gangrene: Secondary | ICD-10-CM | POA: Diagnosis not present

## 2016-08-27 DIAGNOSIS — R0600 Dyspnea, unspecified: Secondary | ICD-10-CM | POA: Diagnosis not present

## 2016-08-27 DIAGNOSIS — R0602 Shortness of breath: Secondary | ICD-10-CM | POA: Diagnosis not present

## 2016-08-27 DIAGNOSIS — Z87891 Personal history of nicotine dependence: Secondary | ICD-10-CM | POA: Diagnosis not present

## 2016-08-27 DIAGNOSIS — J45901 Unspecified asthma with (acute) exacerbation: Secondary | ICD-10-CM | POA: Diagnosis not present

## 2016-08-27 DIAGNOSIS — I10 Essential (primary) hypertension: Secondary | ICD-10-CM | POA: Diagnosis not present

## 2016-11-04 DIAGNOSIS — R531 Weakness: Secondary | ICD-10-CM | POA: Diagnosis not present

## 2016-11-04 DIAGNOSIS — R42 Dizziness and giddiness: Secondary | ICD-10-CM | POA: Diagnosis not present

## 2016-11-04 DIAGNOSIS — Z87891 Personal history of nicotine dependence: Secondary | ICD-10-CM | POA: Diagnosis not present

## 2016-11-04 DIAGNOSIS — R110 Nausea: Secondary | ICD-10-CM | POA: Diagnosis not present

## 2016-11-04 DIAGNOSIS — I10 Essential (primary) hypertension: Secondary | ICD-10-CM | POA: Diagnosis not present

## 2016-11-07 DIAGNOSIS — J45901 Unspecified asthma with (acute) exacerbation: Secondary | ICD-10-CM | POA: Diagnosis not present

## 2016-11-07 DIAGNOSIS — K449 Diaphragmatic hernia without obstruction or gangrene: Secondary | ICD-10-CM | POA: Diagnosis not present

## 2016-11-07 DIAGNOSIS — I10 Essential (primary) hypertension: Secondary | ICD-10-CM | POA: Diagnosis not present

## 2016-11-07 DIAGNOSIS — J45909 Unspecified asthma, uncomplicated: Secondary | ICD-10-CM | POA: Diagnosis not present

## 2016-11-07 DIAGNOSIS — R0602 Shortness of breath: Secondary | ICD-10-CM | POA: Diagnosis not present

## 2016-11-07 DIAGNOSIS — Z87891 Personal history of nicotine dependence: Secondary | ICD-10-CM | POA: Diagnosis not present

## 2016-11-12 DIAGNOSIS — I10 Essential (primary) hypertension: Secondary | ICD-10-CM | POA: Diagnosis not present

## 2016-11-12 DIAGNOSIS — Z0000 Encounter for general adult medical examination without abnormal findings: Secondary | ICD-10-CM | POA: Diagnosis not present

## 2016-11-12 DIAGNOSIS — Z780 Asymptomatic menopausal state: Secondary | ICD-10-CM | POA: Diagnosis not present

## 2016-11-12 DIAGNOSIS — E039 Hypothyroidism, unspecified: Secondary | ICD-10-CM | POA: Diagnosis not present

## 2016-12-15 DIAGNOSIS — E039 Hypothyroidism, unspecified: Secondary | ICD-10-CM | POA: Diagnosis not present

## 2016-12-15 DIAGNOSIS — N3000 Acute cystitis without hematuria: Secondary | ICD-10-CM | POA: Diagnosis not present

## 2016-12-15 DIAGNOSIS — J4520 Mild intermittent asthma, uncomplicated: Secondary | ICD-10-CM | POA: Diagnosis not present

## 2016-12-15 DIAGNOSIS — K551 Chronic vascular disorders of intestine: Secondary | ICD-10-CM | POA: Diagnosis not present

## 2016-12-15 DIAGNOSIS — I700 Atherosclerosis of aorta: Secondary | ICD-10-CM | POA: Diagnosis not present

## 2016-12-15 DIAGNOSIS — I10 Essential (primary) hypertension: Secondary | ICD-10-CM | POA: Diagnosis not present

## 2016-12-15 DIAGNOSIS — R0602 Shortness of breath: Secondary | ICD-10-CM | POA: Diagnosis not present

## 2016-12-15 DIAGNOSIS — I071 Rheumatic tricuspid insufficiency: Secondary | ICD-10-CM | POA: Diagnosis not present

## 2016-12-15 DIAGNOSIS — K828 Other specified diseases of gallbladder: Secondary | ICD-10-CM | POA: Diagnosis not present

## 2016-12-15 DIAGNOSIS — R112 Nausea with vomiting, unspecified: Secondary | ICD-10-CM | POA: Diagnosis not present

## 2016-12-15 DIAGNOSIS — I499 Cardiac arrhythmia, unspecified: Secondary | ICD-10-CM | POA: Diagnosis not present

## 2016-12-15 DIAGNOSIS — F0390 Unspecified dementia without behavioral disturbance: Secondary | ICD-10-CM | POA: Diagnosis not present

## 2016-12-15 DIAGNOSIS — R000 Tachycardia, unspecified: Secondary | ICD-10-CM | POA: Diagnosis not present

## 2016-12-15 DIAGNOSIS — K8020 Calculus of gallbladder without cholecystitis without obstruction: Secondary | ICD-10-CM | POA: Diagnosis not present

## 2016-12-15 DIAGNOSIS — R001 Bradycardia, unspecified: Secondary | ICD-10-CM | POA: Diagnosis not present

## 2016-12-15 DIAGNOSIS — I455 Other specified heart block: Secondary | ICD-10-CM | POA: Diagnosis not present

## 2016-12-15 DIAGNOSIS — R1013 Epigastric pain: Secondary | ICD-10-CM | POA: Diagnosis not present

## 2016-12-15 DIAGNOSIS — Z79899 Other long term (current) drug therapy: Secondary | ICD-10-CM | POA: Diagnosis not present

## 2016-12-15 DIAGNOSIS — Z87891 Personal history of nicotine dependence: Secondary | ICD-10-CM | POA: Diagnosis not present

## 2016-12-15 DIAGNOSIS — R948 Abnormal results of function studies of other organs and systems: Secondary | ICD-10-CM | POA: Diagnosis not present

## 2016-12-15 DIAGNOSIS — J4521 Mild intermittent asthma with (acute) exacerbation: Secondary | ICD-10-CM | POA: Diagnosis not present

## 2016-12-15 DIAGNOSIS — I480 Paroxysmal atrial fibrillation: Secondary | ICD-10-CM | POA: Diagnosis not present

## 2016-12-15 DIAGNOSIS — I340 Nonrheumatic mitral (valve) insufficiency: Secondary | ICD-10-CM | POA: Diagnosis not present

## 2016-12-15 DIAGNOSIS — K449 Diaphragmatic hernia without obstruction or gangrene: Secondary | ICD-10-CM | POA: Diagnosis not present

## 2016-12-16 DIAGNOSIS — R000 Tachycardia, unspecified: Secondary | ICD-10-CM | POA: Diagnosis not present

## 2016-12-16 DIAGNOSIS — K838 Other specified diseases of biliary tract: Secondary | ICD-10-CM | POA: Diagnosis not present

## 2016-12-16 DIAGNOSIS — K229 Disease of esophagus, unspecified: Secondary | ICD-10-CM | POA: Diagnosis not present

## 2016-12-16 DIAGNOSIS — N3000 Acute cystitis without hematuria: Secondary | ICD-10-CM | POA: Diagnosis not present

## 2016-12-16 DIAGNOSIS — I369 Nonrheumatic tricuspid valve disorder, unspecified: Secondary | ICD-10-CM | POA: Diagnosis not present

## 2016-12-16 DIAGNOSIS — I348 Other nonrheumatic mitral valve disorders: Secondary | ICD-10-CM | POA: Diagnosis not present

## 2016-12-16 DIAGNOSIS — I10 Essential (primary) hypertension: Secondary | ICD-10-CM | POA: Diagnosis not present

## 2016-12-16 DIAGNOSIS — J45909 Unspecified asthma, uncomplicated: Secondary | ICD-10-CM | POA: Diagnosis not present

## 2016-12-17 DIAGNOSIS — N3000 Acute cystitis without hematuria: Secondary | ICD-10-CM | POA: Diagnosis not present

## 2016-12-17 DIAGNOSIS — R000 Tachycardia, unspecified: Secondary | ICD-10-CM | POA: Diagnosis not present

## 2016-12-17 DIAGNOSIS — K838 Other specified diseases of biliary tract: Secondary | ICD-10-CM | POA: Diagnosis not present

## 2016-12-17 DIAGNOSIS — K229 Disease of esophagus, unspecified: Secondary | ICD-10-CM | POA: Diagnosis not present

## 2016-12-17 DIAGNOSIS — J45909 Unspecified asthma, uncomplicated: Secondary | ICD-10-CM | POA: Diagnosis not present

## 2016-12-17 DIAGNOSIS — K449 Diaphragmatic hernia without obstruction or gangrene: Secondary | ICD-10-CM | POA: Diagnosis not present

## 2016-12-17 DIAGNOSIS — K219 Gastro-esophageal reflux disease without esophagitis: Secondary | ICD-10-CM | POA: Diagnosis not present

## 2016-12-17 DIAGNOSIS — R938 Abnormal findings on diagnostic imaging of other specified body structures: Secondary | ICD-10-CM | POA: Diagnosis not present

## 2016-12-17 DIAGNOSIS — R710 Precipitous drop in hematocrit: Secondary | ICD-10-CM | POA: Diagnosis not present

## 2016-12-19 DIAGNOSIS — R0602 Shortness of breath: Secondary | ICD-10-CM | POA: Diagnosis not present

## 2016-12-19 DIAGNOSIS — K449 Diaphragmatic hernia without obstruction or gangrene: Secondary | ICD-10-CM | POA: Diagnosis not present

## 2016-12-19 DIAGNOSIS — I10 Essential (primary) hypertension: Secondary | ICD-10-CM | POA: Diagnosis not present

## 2016-12-19 DIAGNOSIS — Z79899 Other long term (current) drug therapy: Secondary | ICD-10-CM | POA: Diagnosis not present

## 2016-12-19 DIAGNOSIS — Z87891 Personal history of nicotine dependence: Secondary | ICD-10-CM | POA: Diagnosis not present

## 2016-12-22 DIAGNOSIS — R000 Tachycardia, unspecified: Secondary | ICD-10-CM | POA: Diagnosis not present

## 2017-01-03 DIAGNOSIS — R062 Wheezing: Secondary | ICD-10-CM | POA: Diagnosis not present

## 2017-01-03 DIAGNOSIS — J4521 Mild intermittent asthma with (acute) exacerbation: Secondary | ICD-10-CM | POA: Diagnosis not present

## 2017-01-03 DIAGNOSIS — R0602 Shortness of breath: Secondary | ICD-10-CM | POA: Diagnosis not present

## 2017-01-03 DIAGNOSIS — Z87891 Personal history of nicotine dependence: Secondary | ICD-10-CM | POA: Diagnosis not present

## 2017-01-03 DIAGNOSIS — I10 Essential (primary) hypertension: Secondary | ICD-10-CM | POA: Diagnosis not present

## 2017-01-03 DIAGNOSIS — J45901 Unspecified asthma with (acute) exacerbation: Secondary | ICD-10-CM | POA: Diagnosis not present

## 2017-01-03 DIAGNOSIS — R0600 Dyspnea, unspecified: Secondary | ICD-10-CM | POA: Diagnosis not present

## 2017-01-19 DIAGNOSIS — R000 Tachycardia, unspecified: Secondary | ICD-10-CM | POA: Diagnosis not present

## 2017-01-27 IMAGING — CR DG CHEST 2V
2 series · 2 of 2 positions shown · non-contrast
Comparison: 10/20/2014

CLINICAL DATA: Weakness and shortness of Breath

EXAM:
CHEST - 2 VIEW

[w chest pa]
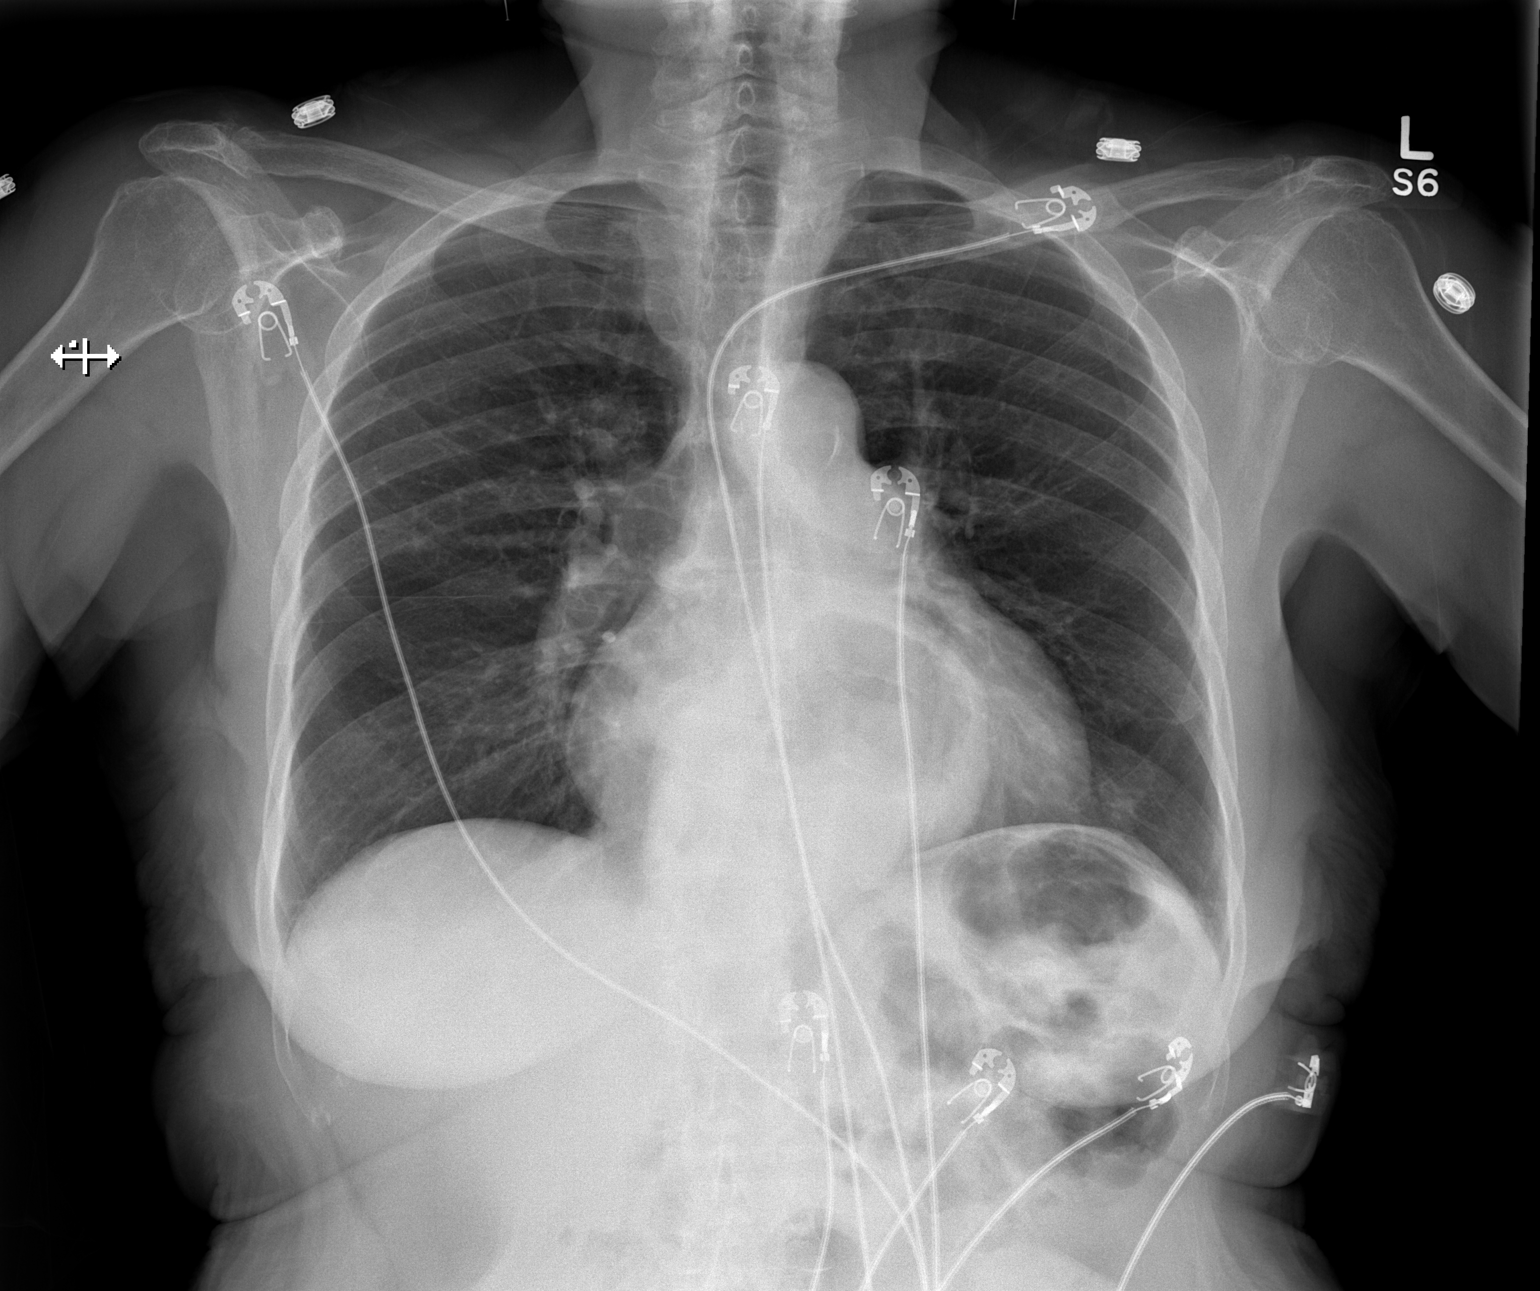

[w chest lat]
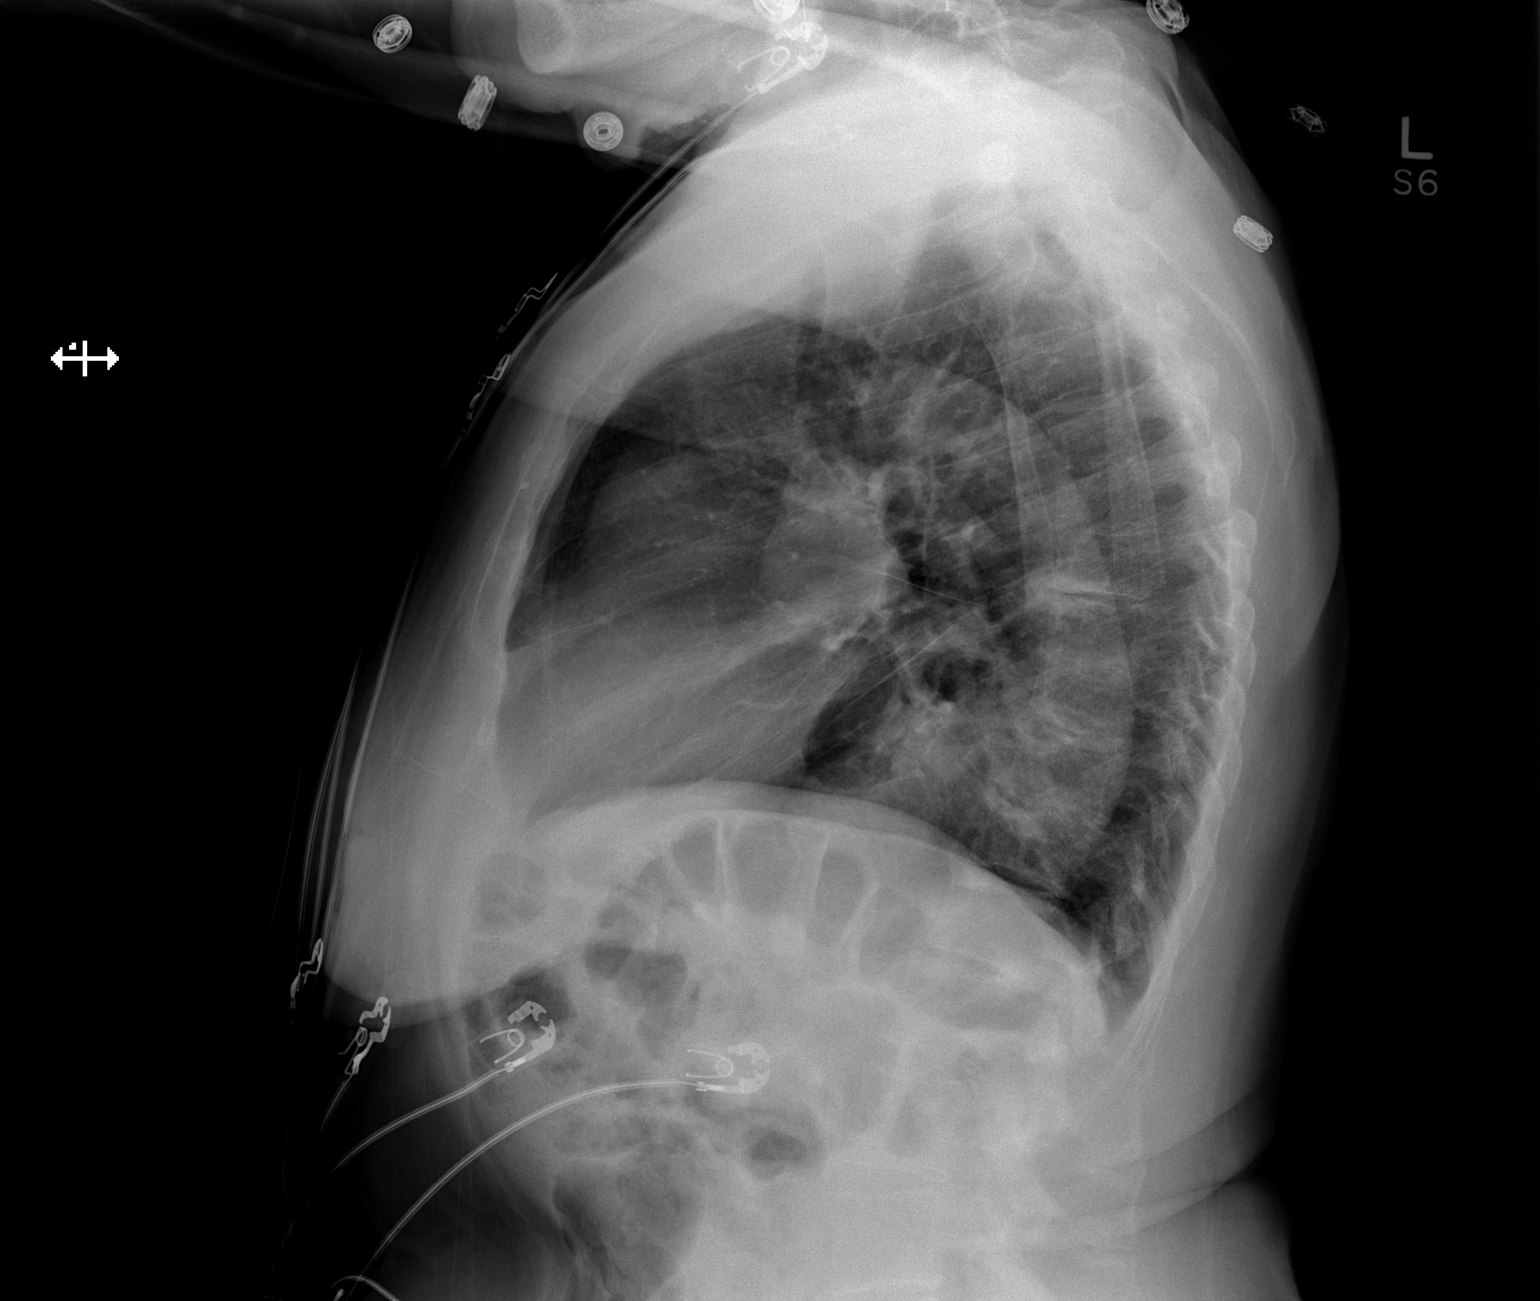

[2 of 2 positions shown; findings below may reference images not displayed]

FINDINGS: Cardiac shadow is mildly enlarged but stable. A large hiatal hernia
is again seen. The lungs are well aerated bilaterally. No focal
infiltrate or sizable effusion is noted. Degenerative changes of the
thoracic spine are seen.
IMPRESSION: No acute abnormality noted.

## 2017-02-19 DIAGNOSIS — R0602 Shortness of breath: Secondary | ICD-10-CM | POA: Diagnosis not present

## 2017-04-28 DIAGNOSIS — Z79899 Other long term (current) drug therapy: Secondary | ICD-10-CM | POA: Diagnosis not present

## 2017-04-28 DIAGNOSIS — Z87448 Personal history of other diseases of urinary system: Secondary | ICD-10-CM | POA: Diagnosis not present

## 2017-04-28 DIAGNOSIS — Z87891 Personal history of nicotine dependence: Secondary | ICD-10-CM | POA: Diagnosis not present

## 2017-04-28 DIAGNOSIS — R42 Dizziness and giddiness: Secondary | ICD-10-CM | POA: Diagnosis not present

## 2017-04-28 DIAGNOSIS — Z825 Family history of asthma and other chronic lower respiratory diseases: Secondary | ICD-10-CM | POA: Diagnosis not present

## 2017-04-28 DIAGNOSIS — Z811 Family history of alcohol abuse and dependence: Secondary | ICD-10-CM | POA: Diagnosis not present

## 2017-04-28 DIAGNOSIS — J45909 Unspecified asthma, uncomplicated: Secondary | ICD-10-CM | POA: Diagnosis not present

## 2017-04-28 DIAGNOSIS — I10 Essential (primary) hypertension: Secondary | ICD-10-CM | POA: Diagnosis not present

## 2017-04-28 DIAGNOSIS — Z886 Allergy status to analgesic agent status: Secondary | ICD-10-CM | POA: Diagnosis not present

## 2017-04-28 DIAGNOSIS — Z880 Allergy status to penicillin: Secondary | ICD-10-CM | POA: Diagnosis not present

## 2017-04-28 DIAGNOSIS — E079 Disorder of thyroid, unspecified: Secondary | ICD-10-CM | POA: Diagnosis not present

## 2017-06-09 DIAGNOSIS — R0602 Shortness of breath: Secondary | ICD-10-CM | POA: Diagnosis not present

## 2017-06-25 DIAGNOSIS — Z87891 Personal history of nicotine dependence: Secondary | ICD-10-CM | POA: Diagnosis not present

## 2017-06-25 DIAGNOSIS — E079 Disorder of thyroid, unspecified: Secondary | ICD-10-CM | POA: Diagnosis not present

## 2017-06-25 DIAGNOSIS — R42 Dizziness and giddiness: Secondary | ICD-10-CM | POA: Diagnosis not present

## 2017-06-25 DIAGNOSIS — R062 Wheezing: Secondary | ICD-10-CM | POA: Diagnosis not present

## 2017-06-25 DIAGNOSIS — Z79899 Other long term (current) drug therapy: Secondary | ICD-10-CM | POA: Diagnosis not present

## 2017-06-25 DIAGNOSIS — R0602 Shortness of breath: Secondary | ICD-10-CM | POA: Diagnosis not present

## 2017-06-25 DIAGNOSIS — J45909 Unspecified asthma, uncomplicated: Secondary | ICD-10-CM | POA: Diagnosis not present

## 2017-06-25 DIAGNOSIS — I10 Essential (primary) hypertension: Secondary | ICD-10-CM | POA: Diagnosis not present

## 2017-06-25 DIAGNOSIS — R5381 Other malaise: Secondary | ICD-10-CM | POA: Diagnosis not present

## 2017-07-10 DIAGNOSIS — F0280 Dementia in other diseases classified elsewhere without behavioral disturbance: Secondary | ICD-10-CM | POA: Diagnosis not present

## 2017-07-10 DIAGNOSIS — G309 Alzheimer's disease, unspecified: Secondary | ICD-10-CM | POA: Diagnosis not present

## 2017-07-10 DIAGNOSIS — I10 Essential (primary) hypertension: Secondary | ICD-10-CM | POA: Diagnosis not present

## 2017-07-10 DIAGNOSIS — R0602 Shortness of breath: Secondary | ICD-10-CM | POA: Diagnosis not present

## 2017-07-10 DIAGNOSIS — J45909 Unspecified asthma, uncomplicated: Secondary | ICD-10-CM | POA: Diagnosis not present

## 2017-07-10 DIAGNOSIS — R062 Wheezing: Secondary | ICD-10-CM | POA: Diagnosis not present

## 2017-07-17 DIAGNOSIS — R0602 Shortness of breath: Secondary | ICD-10-CM | POA: Diagnosis not present

## 2017-07-22 DIAGNOSIS — H8113 Benign paroxysmal vertigo, bilateral: Secondary | ICD-10-CM | POA: Diagnosis not present

## 2017-07-22 DIAGNOSIS — J4540 Moderate persistent asthma, uncomplicated: Secondary | ICD-10-CM | POA: Diagnosis not present

## 2017-08-28 DIAGNOSIS — R0602 Shortness of breath: Secondary | ICD-10-CM | POA: Diagnosis not present

## 2017-09-01 DIAGNOSIS — J4520 Mild intermittent asthma, uncomplicated: Secondary | ICD-10-CM | POA: Diagnosis not present

## 2017-09-11 DIAGNOSIS — R0602 Shortness of breath: Secondary | ICD-10-CM | POA: Diagnosis not present

## 2017-10-23 DIAGNOSIS — R0602 Shortness of breath: Secondary | ICD-10-CM | POA: Diagnosis not present

## 2017-10-27 DIAGNOSIS — G309 Alzheimer's disease, unspecified: Secondary | ICD-10-CM | POA: Diagnosis not present

## 2017-10-27 DIAGNOSIS — Z87448 Personal history of other diseases of urinary system: Secondary | ICD-10-CM | POA: Diagnosis not present

## 2017-10-27 DIAGNOSIS — Z811 Family history of alcohol abuse and dependence: Secondary | ICD-10-CM | POA: Diagnosis not present

## 2017-10-27 DIAGNOSIS — K229 Disease of esophagus, unspecified: Secondary | ICD-10-CM | POA: Diagnosis not present

## 2017-10-27 DIAGNOSIS — Z825 Family history of asthma and other chronic lower respiratory diseases: Secondary | ICD-10-CM | POA: Diagnosis not present

## 2017-10-27 DIAGNOSIS — Z87891 Personal history of nicotine dependence: Secondary | ICD-10-CM | POA: Diagnosis not present

## 2017-10-27 DIAGNOSIS — Z880 Allergy status to penicillin: Secondary | ICD-10-CM | POA: Diagnosis not present

## 2017-10-27 DIAGNOSIS — F0280 Dementia in other diseases classified elsewhere without behavioral disturbance: Secondary | ICD-10-CM | POA: Diagnosis not present

## 2017-10-27 DIAGNOSIS — Z79899 Other long term (current) drug therapy: Secondary | ICD-10-CM | POA: Diagnosis not present

## 2017-10-27 DIAGNOSIS — J4521 Mild intermittent asthma with (acute) exacerbation: Secondary | ICD-10-CM | POA: Diagnosis not present

## 2017-10-27 DIAGNOSIS — I10 Essential (primary) hypertension: Secondary | ICD-10-CM | POA: Diagnosis not present

## 2017-10-27 DIAGNOSIS — E079 Disorder of thyroid, unspecified: Secondary | ICD-10-CM | POA: Diagnosis not present

## 2017-10-27 DIAGNOSIS — Z886 Allergy status to analgesic agent status: Secondary | ICD-10-CM | POA: Diagnosis not present

## 2017-11-06 DIAGNOSIS — R0602 Shortness of breath: Secondary | ICD-10-CM | POA: Diagnosis not present

## 2017-11-06 DIAGNOSIS — R092 Respiratory arrest: Secondary | ICD-10-CM | POA: Diagnosis not present

## 2017-11-07 DIAGNOSIS — R062 Wheezing: Secondary | ICD-10-CM | POA: Diagnosis not present

## 2017-11-07 DIAGNOSIS — Z87891 Personal history of nicotine dependence: Secondary | ICD-10-CM | POA: Diagnosis not present

## 2017-11-07 DIAGNOSIS — J45901 Unspecified asthma with (acute) exacerbation: Secondary | ICD-10-CM | POA: Diagnosis not present

## 2017-11-07 DIAGNOSIS — J4521 Mild intermittent asthma with (acute) exacerbation: Secondary | ICD-10-CM | POA: Diagnosis not present

## 2017-11-07 DIAGNOSIS — K449 Diaphragmatic hernia without obstruction or gangrene: Secondary | ICD-10-CM | POA: Diagnosis not present

## 2017-11-07 DIAGNOSIS — I10 Essential (primary) hypertension: Secondary | ICD-10-CM | POA: Diagnosis not present

## 2017-11-07 DIAGNOSIS — R0602 Shortness of breath: Secondary | ICD-10-CM | POA: Diagnosis not present

## 2017-11-14 DIAGNOSIS — Z79890 Hormone replacement therapy (postmenopausal): Secondary | ICD-10-CM | POA: Diagnosis not present

## 2017-11-14 DIAGNOSIS — I10 Essential (primary) hypertension: Secondary | ICD-10-CM | POA: Diagnosis not present

## 2017-11-14 DIAGNOSIS — Z886 Allergy status to analgesic agent status: Secondary | ICD-10-CM | POA: Diagnosis not present

## 2017-11-14 DIAGNOSIS — Z79899 Other long term (current) drug therapy: Secondary | ICD-10-CM | POA: Diagnosis not present

## 2017-11-14 DIAGNOSIS — R0600 Dyspnea, unspecified: Secondary | ICD-10-CM | POA: Diagnosis not present

## 2017-11-14 DIAGNOSIS — Z713 Dietary counseling and surveillance: Secondary | ICD-10-CM | POA: Diagnosis not present

## 2017-11-14 DIAGNOSIS — J4521 Mild intermittent asthma with (acute) exacerbation: Secondary | ICD-10-CM | POA: Diagnosis not present

## 2017-11-14 DIAGNOSIS — E039 Hypothyroidism, unspecified: Secondary | ICD-10-CM | POA: Diagnosis not present

## 2017-11-14 DIAGNOSIS — J45909 Unspecified asthma, uncomplicated: Secondary | ICD-10-CM | POA: Diagnosis not present

## 2017-11-14 DIAGNOSIS — F0390 Unspecified dementia without behavioral disturbance: Secondary | ICD-10-CM | POA: Diagnosis not present

## 2017-11-14 DIAGNOSIS — Z880 Allergy status to penicillin: Secondary | ICD-10-CM | POA: Diagnosis not present

## 2017-11-14 DIAGNOSIS — E0789 Other specified disorders of thyroid: Secondary | ICD-10-CM | POA: Diagnosis not present

## 2017-11-15 DIAGNOSIS — F0390 Unspecified dementia without behavioral disturbance: Secondary | ICD-10-CM | POA: Diagnosis not present

## 2017-11-15 DIAGNOSIS — E039 Hypothyroidism, unspecified: Secondary | ICD-10-CM | POA: Diagnosis not present

## 2017-11-15 DIAGNOSIS — I10 Essential (primary) hypertension: Secondary | ICD-10-CM | POA: Diagnosis not present

## 2017-11-15 DIAGNOSIS — H8113 Benign paroxysmal vertigo, bilateral: Secondary | ICD-10-CM | POA: Diagnosis not present

## 2017-11-15 DIAGNOSIS — J4520 Mild intermittent asthma, uncomplicated: Secondary | ICD-10-CM | POA: Diagnosis not present

## 2017-11-30 DIAGNOSIS — R0609 Other forms of dyspnea: Secondary | ICD-10-CM | POA: Diagnosis not present

## 2017-12-22 DIAGNOSIS — Z23 Encounter for immunization: Secondary | ICD-10-CM | POA: Diagnosis not present

## 2017-12-22 DIAGNOSIS — E039 Hypothyroidism, unspecified: Secondary | ICD-10-CM | POA: Diagnosis not present

## 2017-12-22 DIAGNOSIS — Z780 Asymptomatic menopausal state: Secondary | ICD-10-CM | POA: Diagnosis not present

## 2017-12-22 DIAGNOSIS — F418 Other specified anxiety disorders: Secondary | ICD-10-CM | POA: Diagnosis not present

## 2017-12-22 DIAGNOSIS — H6122 Impacted cerumen, left ear: Secondary | ICD-10-CM | POA: Diagnosis not present

## 2017-12-22 DIAGNOSIS — I10 Essential (primary) hypertension: Secondary | ICD-10-CM | POA: Diagnosis not present

## 2017-12-22 DIAGNOSIS — H8113 Benign paroxysmal vertigo, bilateral: Secondary | ICD-10-CM | POA: Diagnosis not present

## 2017-12-22 DIAGNOSIS — Z79899 Other long term (current) drug therapy: Secondary | ICD-10-CM | POA: Diagnosis not present

## 2017-12-22 DIAGNOSIS — Z0000 Encounter for general adult medical examination without abnormal findings: Secondary | ICD-10-CM | POA: Diagnosis not present

## 2018-01-25 DIAGNOSIS — R0602 Shortness of breath: Secondary | ICD-10-CM | POA: Diagnosis not present

## 2018-01-30 DIAGNOSIS — R0602 Shortness of breath: Secondary | ICD-10-CM | POA: Diagnosis not present

## 2018-03-09 DIAGNOSIS — F0390 Unspecified dementia without behavioral disturbance: Secondary | ICD-10-CM | POA: Diagnosis not present

## 2018-03-09 DIAGNOSIS — Z880 Allergy status to penicillin: Secondary | ICD-10-CM | POA: Diagnosis not present

## 2018-03-09 DIAGNOSIS — Z87891 Personal history of nicotine dependence: Secondary | ICD-10-CM | POA: Diagnosis not present

## 2018-03-09 DIAGNOSIS — I10 Essential (primary) hypertension: Secondary | ICD-10-CM | POA: Diagnosis not present

## 2018-03-09 DIAGNOSIS — J4541 Moderate persistent asthma with (acute) exacerbation: Secondary | ICD-10-CM | POA: Diagnosis not present

## 2018-03-09 DIAGNOSIS — R0602 Shortness of breath: Secondary | ICD-10-CM | POA: Diagnosis not present

## 2018-03-09 DIAGNOSIS — Z79899 Other long term (current) drug therapy: Secondary | ICD-10-CM | POA: Diagnosis not present

## 2018-03-09 DIAGNOSIS — Z886 Allergy status to analgesic agent status: Secondary | ICD-10-CM | POA: Diagnosis not present

## 2018-04-03 DIAGNOSIS — Z880 Allergy status to penicillin: Secondary | ICD-10-CM | POA: Diagnosis not present

## 2018-04-03 DIAGNOSIS — J45901 Unspecified asthma with (acute) exacerbation: Secondary | ICD-10-CM | POA: Diagnosis not present

## 2018-04-03 DIAGNOSIS — J4520 Mild intermittent asthma, uncomplicated: Secondary | ICD-10-CM | POA: Diagnosis not present

## 2018-04-03 DIAGNOSIS — Z79899 Other long term (current) drug therapy: Secondary | ICD-10-CM | POA: Diagnosis not present

## 2018-06-06 DIAGNOSIS — Z79899 Other long term (current) drug therapy: Secondary | ICD-10-CM | POA: Diagnosis not present

## 2018-06-06 DIAGNOSIS — F418 Other specified anxiety disorders: Secondary | ICD-10-CM | POA: Diagnosis not present

## 2018-06-06 DIAGNOSIS — R443 Hallucinations, unspecified: Secondary | ICD-10-CM | POA: Diagnosis not present

## 2018-06-06 DIAGNOSIS — Z9114 Patient's other noncompliance with medication regimen: Secondary | ICD-10-CM | POA: Diagnosis not present

## 2018-06-06 DIAGNOSIS — Z87898 Personal history of other specified conditions: Secondary | ICD-10-CM | POA: Diagnosis not present

## 2018-06-06 DIAGNOSIS — I10 Essential (primary) hypertension: Secondary | ICD-10-CM | POA: Diagnosis not present

## 2018-06-06 DIAGNOSIS — E039 Hypothyroidism, unspecified: Secondary | ICD-10-CM | POA: Diagnosis not present

## 2018-06-06 DIAGNOSIS — Z780 Asymptomatic menopausal state: Secondary | ICD-10-CM | POA: Diagnosis not present

## 2018-06-06 DIAGNOSIS — F0390 Unspecified dementia without behavioral disturbance: Secondary | ICD-10-CM | POA: Diagnosis not present

## 2018-06-12 DIAGNOSIS — H8110 Benign paroxysmal vertigo, unspecified ear: Secondary | ICD-10-CM | POA: Diagnosis not present

## 2018-06-12 DIAGNOSIS — J4520 Mild intermittent asthma, uncomplicated: Secondary | ICD-10-CM | POA: Diagnosis not present

## 2018-06-12 DIAGNOSIS — H8193 Unspecified disorder of vestibular function, bilateral: Secondary | ICD-10-CM | POA: Diagnosis not present

## 2018-06-12 DIAGNOSIS — I10 Essential (primary) hypertension: Secondary | ICD-10-CM | POA: Diagnosis not present

## 2018-06-12 DIAGNOSIS — F0390 Unspecified dementia without behavioral disturbance: Secondary | ICD-10-CM | POA: Diagnosis not present

## 2018-06-12 DIAGNOSIS — F419 Anxiety disorder, unspecified: Secondary | ICD-10-CM | POA: Diagnosis not present

## 2018-06-14 DIAGNOSIS — H8193 Unspecified disorder of vestibular function, bilateral: Secondary | ICD-10-CM | POA: Diagnosis not present

## 2018-06-14 DIAGNOSIS — J4520 Mild intermittent asthma, uncomplicated: Secondary | ICD-10-CM | POA: Diagnosis not present

## 2018-06-14 DIAGNOSIS — F0390 Unspecified dementia without behavioral disturbance: Secondary | ICD-10-CM | POA: Diagnosis not present

## 2018-06-14 DIAGNOSIS — F419 Anxiety disorder, unspecified: Secondary | ICD-10-CM | POA: Diagnosis not present

## 2018-06-14 DIAGNOSIS — I10 Essential (primary) hypertension: Secondary | ICD-10-CM | POA: Diagnosis not present

## 2018-06-14 DIAGNOSIS — H8110 Benign paroxysmal vertigo, unspecified ear: Secondary | ICD-10-CM | POA: Diagnosis not present

## 2018-06-16 DIAGNOSIS — H8193 Unspecified disorder of vestibular function, bilateral: Secondary | ICD-10-CM | POA: Diagnosis not present

## 2018-06-16 DIAGNOSIS — F0390 Unspecified dementia without behavioral disturbance: Secondary | ICD-10-CM | POA: Diagnosis not present

## 2018-06-16 DIAGNOSIS — I10 Essential (primary) hypertension: Secondary | ICD-10-CM | POA: Diagnosis not present

## 2018-06-16 DIAGNOSIS — F419 Anxiety disorder, unspecified: Secondary | ICD-10-CM | POA: Diagnosis not present

## 2018-06-16 DIAGNOSIS — J4520 Mild intermittent asthma, uncomplicated: Secondary | ICD-10-CM | POA: Diagnosis not present

## 2018-06-16 DIAGNOSIS — H8110 Benign paroxysmal vertigo, unspecified ear: Secondary | ICD-10-CM | POA: Diagnosis not present

## 2018-06-20 DIAGNOSIS — H8110 Benign paroxysmal vertigo, unspecified ear: Secondary | ICD-10-CM | POA: Diagnosis not present

## 2018-06-20 DIAGNOSIS — I10 Essential (primary) hypertension: Secondary | ICD-10-CM | POA: Diagnosis not present

## 2018-06-20 DIAGNOSIS — F419 Anxiety disorder, unspecified: Secondary | ICD-10-CM | POA: Diagnosis not present

## 2018-06-20 DIAGNOSIS — F0390 Unspecified dementia without behavioral disturbance: Secondary | ICD-10-CM | POA: Diagnosis not present

## 2018-06-20 DIAGNOSIS — J4520 Mild intermittent asthma, uncomplicated: Secondary | ICD-10-CM | POA: Diagnosis not present

## 2018-06-20 DIAGNOSIS — H8193 Unspecified disorder of vestibular function, bilateral: Secondary | ICD-10-CM | POA: Diagnosis not present

## 2018-06-23 DIAGNOSIS — H8110 Benign paroxysmal vertigo, unspecified ear: Secondary | ICD-10-CM | POA: Diagnosis not present

## 2018-06-23 DIAGNOSIS — I10 Essential (primary) hypertension: Secondary | ICD-10-CM | POA: Diagnosis not present

## 2018-06-23 DIAGNOSIS — F419 Anxiety disorder, unspecified: Secondary | ICD-10-CM | POA: Diagnosis not present

## 2018-06-23 DIAGNOSIS — J4520 Mild intermittent asthma, uncomplicated: Secondary | ICD-10-CM | POA: Diagnosis not present

## 2018-06-23 DIAGNOSIS — R443 Hallucinations, unspecified: Secondary | ICD-10-CM | POA: Diagnosis not present

## 2018-06-23 DIAGNOSIS — F0390 Unspecified dementia without behavioral disturbance: Secondary | ICD-10-CM | POA: Diagnosis not present

## 2018-06-23 DIAGNOSIS — H8193 Unspecified disorder of vestibular function, bilateral: Secondary | ICD-10-CM | POA: Diagnosis not present

## 2018-06-28 DIAGNOSIS — I10 Essential (primary) hypertension: Secondary | ICD-10-CM | POA: Diagnosis not present

## 2018-06-28 DIAGNOSIS — H8193 Unspecified disorder of vestibular function, bilateral: Secondary | ICD-10-CM | POA: Diagnosis not present

## 2018-06-28 DIAGNOSIS — F0390 Unspecified dementia without behavioral disturbance: Secondary | ICD-10-CM | POA: Diagnosis not present

## 2018-06-28 DIAGNOSIS — H8110 Benign paroxysmal vertigo, unspecified ear: Secondary | ICD-10-CM | POA: Diagnosis not present

## 2018-06-28 DIAGNOSIS — J4520 Mild intermittent asthma, uncomplicated: Secondary | ICD-10-CM | POA: Diagnosis not present

## 2018-06-28 DIAGNOSIS — F419 Anxiety disorder, unspecified: Secondary | ICD-10-CM | POA: Diagnosis not present

## 2018-07-07 DIAGNOSIS — H8110 Benign paroxysmal vertigo, unspecified ear: Secondary | ICD-10-CM | POA: Diagnosis not present

## 2018-07-07 DIAGNOSIS — J4520 Mild intermittent asthma, uncomplicated: Secondary | ICD-10-CM | POA: Diagnosis not present

## 2018-07-07 DIAGNOSIS — F0390 Unspecified dementia without behavioral disturbance: Secondary | ICD-10-CM | POA: Diagnosis not present

## 2018-07-07 DIAGNOSIS — H8193 Unspecified disorder of vestibular function, bilateral: Secondary | ICD-10-CM | POA: Diagnosis not present

## 2018-07-07 DIAGNOSIS — I10 Essential (primary) hypertension: Secondary | ICD-10-CM | POA: Diagnosis not present

## 2018-07-07 DIAGNOSIS — F419 Anxiety disorder, unspecified: Secondary | ICD-10-CM | POA: Diagnosis not present

## 2018-07-26 ENCOUNTER — Other Ambulatory Visit: Payer: Self-pay

## 2018-09-06 DIAGNOSIS — H8113 Benign paroxysmal vertigo, bilateral: Secondary | ICD-10-CM | POA: Diagnosis not present

## 2018-09-06 DIAGNOSIS — Z780 Asymptomatic menopausal state: Secondary | ICD-10-CM | POA: Diagnosis not present

## 2018-09-06 DIAGNOSIS — F418 Other specified anxiety disorders: Secondary | ICD-10-CM | POA: Diagnosis not present

## 2018-09-06 DIAGNOSIS — I10 Essential (primary) hypertension: Secondary | ICD-10-CM | POA: Diagnosis not present

## 2018-09-06 DIAGNOSIS — J4520 Mild intermittent asthma, uncomplicated: Secondary | ICD-10-CM | POA: Diagnosis not present

## 2018-09-06 DIAGNOSIS — R4189 Other symptoms and signs involving cognitive functions and awareness: Secondary | ICD-10-CM | POA: Diagnosis not present

## 2018-09-06 DIAGNOSIS — E039 Hypothyroidism, unspecified: Secondary | ICD-10-CM | POA: Diagnosis not present

## 2018-10-25 DIAGNOSIS — F05 Delirium due to known physiological condition: Secondary | ICD-10-CM | POA: Diagnosis not present

## 2018-10-25 DIAGNOSIS — F0390 Unspecified dementia without behavioral disturbance: Secondary | ICD-10-CM | POA: Diagnosis not present

## 2018-12-17 DIAGNOSIS — I10 Essential (primary) hypertension: Secondary | ICD-10-CM | POA: Diagnosis not present

## 2018-12-17 DIAGNOSIS — R7989 Other specified abnormal findings of blood chemistry: Secondary | ICD-10-CM | POA: Diagnosis not present

## 2018-12-17 DIAGNOSIS — Z886 Allergy status to analgesic agent status: Secondary | ICD-10-CM | POA: Diagnosis not present

## 2018-12-17 DIAGNOSIS — Z87891 Personal history of nicotine dependence: Secondary | ICD-10-CM | POA: Diagnosis not present

## 2018-12-17 DIAGNOSIS — R079 Chest pain, unspecified: Secondary | ICD-10-CM | POA: Diagnosis not present

## 2018-12-17 DIAGNOSIS — R0981 Nasal congestion: Secondary | ICD-10-CM | POA: Diagnosis not present

## 2018-12-17 DIAGNOSIS — Z7982 Long term (current) use of aspirin: Secondary | ICD-10-CM | POA: Diagnosis not present

## 2018-12-17 DIAGNOSIS — J4541 Moderate persistent asthma with (acute) exacerbation: Secondary | ICD-10-CM | POA: Diagnosis not present

## 2018-12-17 DIAGNOSIS — E039 Hypothyroidism, unspecified: Secondary | ICD-10-CM | POA: Diagnosis not present

## 2018-12-17 DIAGNOSIS — J45901 Unspecified asthma with (acute) exacerbation: Secondary | ICD-10-CM | POA: Diagnosis not present

## 2018-12-17 DIAGNOSIS — F0390 Unspecified dementia without behavioral disturbance: Secondary | ICD-10-CM | POA: Diagnosis not present

## 2018-12-17 DIAGNOSIS — R0602 Shortness of breath: Secondary | ICD-10-CM | POA: Diagnosis not present

## 2018-12-17 DIAGNOSIS — Z79899 Other long term (current) drug therapy: Secondary | ICD-10-CM | POA: Diagnosis not present

## 2018-12-18 DIAGNOSIS — R9431 Abnormal electrocardiogram [ECG] [EKG]: Secondary | ICD-10-CM | POA: Diagnosis not present

## 2018-12-18 DIAGNOSIS — J45901 Unspecified asthma with (acute) exacerbation: Secondary | ICD-10-CM | POA: Diagnosis not present

## 2018-12-18 DIAGNOSIS — R7989 Other specified abnormal findings of blood chemistry: Secondary | ICD-10-CM | POA: Diagnosis not present

## 2018-12-18 DIAGNOSIS — I340 Nonrheumatic mitral (valve) insufficiency: Secondary | ICD-10-CM | POA: Diagnosis not present
